# Patient Record
Sex: Male | Born: 1940 | Race: White | Hispanic: No | Marital: Married | Smoking: Never smoker
Health system: Southern US, Community
[De-identification: ages and names within clinical notes are randomized; demographics above are authoritative.]

## PROBLEM LIST (undated history)

## (undated) DIAGNOSIS — J40 Bronchitis, not specified as acute or chronic: Secondary | ICD-10-CM

## (undated) DIAGNOSIS — E119 Type 2 diabetes mellitus without complications: Secondary | ICD-10-CM

## (undated) DIAGNOSIS — J309 Allergic rhinitis, unspecified: Secondary | ICD-10-CM

## (undated) DIAGNOSIS — M1990 Unspecified osteoarthritis, unspecified site: Secondary | ICD-10-CM

## (undated) DIAGNOSIS — L570 Actinic keratosis: Secondary | ICD-10-CM

## (undated) DIAGNOSIS — N451 Epididymitis: Secondary | ICD-10-CM

## (undated) DIAGNOSIS — E785 Hyperlipidemia, unspecified: Secondary | ICD-10-CM

## (undated) DIAGNOSIS — I10 Essential (primary) hypertension: Secondary | ICD-10-CM

## (undated) HISTORY — PX: BRAIN SURGERY: SHX531

## (undated) HISTORY — PX: THROAT SURGERY: SHX803

## (undated) HISTORY — PX: HERNIA REPAIR: SHX51

---

## 1998-07-04 ENCOUNTER — Emergency Department (HOSPITAL_COMMUNITY): Admission: EM | Admit: 1998-07-04 | Discharge: 1998-07-04 | Payer: Self-pay | Admitting: Emergency Medicine

## 1998-11-12 ENCOUNTER — Encounter: Payer: Self-pay | Admitting: General Surgery

## 1998-11-13 ENCOUNTER — Inpatient Hospital Stay (HOSPITAL_COMMUNITY): Admission: EM | Admit: 1998-11-13 | Discharge: 1998-11-15 | Payer: Self-pay

## 1998-11-13 ENCOUNTER — Encounter: Payer: Self-pay | Admitting: General Surgery

## 2001-11-07 ENCOUNTER — Ambulatory Visit (HOSPITAL_BASED_OUTPATIENT_CLINIC_OR_DEPARTMENT_OTHER): Admission: RE | Admit: 2001-11-07 | Discharge: 2001-11-07 | Payer: Self-pay | Admitting: Plastic Surgery

## 2004-04-07 ENCOUNTER — Other Ambulatory Visit: Admission: RE | Admit: 2004-04-07 | Discharge: 2004-04-07 | Payer: Self-pay | Admitting: Otolaryngology

## 2005-03-03 ENCOUNTER — Ambulatory Visit: Payer: Self-pay | Admitting: Internal Medicine

## 2005-03-16 ENCOUNTER — Ambulatory Visit: Payer: Self-pay | Admitting: Internal Medicine

## 2013-02-22 ENCOUNTER — Encounter: Payer: Self-pay | Admitting: Cardiovascular Disease

## 2013-02-22 ENCOUNTER — Other Ambulatory Visit (HOSPITAL_COMMUNITY): Payer: Self-pay | Admitting: Internal Medicine

## 2013-02-22 ENCOUNTER — Ambulatory Visit (HOSPITAL_COMMUNITY): Payer: Medicare Other | Attending: Cardiovascular Disease

## 2013-02-22 DIAGNOSIS — I10 Essential (primary) hypertension: Secondary | ICD-10-CM | POA: Insufficient documentation

## 2013-02-22 DIAGNOSIS — E785 Hyperlipidemia, unspecified: Secondary | ICD-10-CM | POA: Insufficient documentation

## 2013-02-22 DIAGNOSIS — R9431 Abnormal electrocardiogram [ECG] [EKG]: Secondary | ICD-10-CM

## 2013-02-22 DIAGNOSIS — E119 Type 2 diabetes mellitus without complications: Secondary | ICD-10-CM | POA: Insufficient documentation

## 2013-02-22 DIAGNOSIS — I059 Rheumatic mitral valve disease, unspecified: Secondary | ICD-10-CM | POA: Insufficient documentation

## 2013-02-22 DIAGNOSIS — I079 Rheumatic tricuspid valve disease, unspecified: Secondary | ICD-10-CM | POA: Insufficient documentation

## 2013-02-22 NOTE — Progress Notes (Signed)
Echocardiogram performed.  

## 2014-09-18 ENCOUNTER — Other Ambulatory Visit: Payer: Self-pay | Admitting: Orthopedic Surgery

## 2014-09-18 DIAGNOSIS — M79672 Pain in left foot: Secondary | ICD-10-CM

## 2014-09-19 ENCOUNTER — Other Ambulatory Visit: Payer: Self-pay

## 2014-09-27 ENCOUNTER — Ambulatory Visit
Admission: RE | Admit: 2014-09-27 | Discharge: 2014-09-27 | Disposition: A | Discharge status: Home / Self Care | Payer: Medicare Other | Source: Ambulatory Visit | Attending: Orthopedic Surgery | Admitting: Orthopedic Surgery

## 2014-09-27 DIAGNOSIS — M79672 Pain in left foot: Secondary | ICD-10-CM

## 2014-10-01 ENCOUNTER — Emergency Department (HOSPITAL_COMMUNITY)
Admission: EM | Admit: 2014-10-01 | Discharge: 2014-10-01 | Disposition: A | Discharge status: Home / Self Care | Payer: Medicare Other | Attending: Emergency Medicine | Admitting: Emergency Medicine

## 2014-10-01 ENCOUNTER — Encounter (HOSPITAL_COMMUNITY): Payer: Self-pay | Admitting: Nurse Practitioner

## 2014-10-01 ENCOUNTER — Emergency Department (HOSPITAL_COMMUNITY): Payer: Medicare Other

## 2014-10-01 DIAGNOSIS — Z87448 Personal history of other diseases of urinary system: Secondary | ICD-10-CM | POA: Insufficient documentation

## 2014-10-01 DIAGNOSIS — R5383 Other fatigue: Secondary | ICD-10-CM | POA: Insufficient documentation

## 2014-10-01 DIAGNOSIS — Z8739 Personal history of other diseases of the musculoskeletal system and connective tissue: Secondary | ICD-10-CM | POA: Diagnosis not present

## 2014-10-01 DIAGNOSIS — R05 Cough: Secondary | ICD-10-CM | POA: Diagnosis present

## 2014-10-01 DIAGNOSIS — I10 Essential (primary) hypertension: Secondary | ICD-10-CM | POA: Insufficient documentation

## 2014-10-01 DIAGNOSIS — J159 Unspecified bacterial pneumonia: Secondary | ICD-10-CM | POA: Diagnosis not present

## 2014-10-01 DIAGNOSIS — J189 Pneumonia, unspecified organism: Secondary | ICD-10-CM

## 2014-10-01 DIAGNOSIS — Z872 Personal history of diseases of the skin and subcutaneous tissue: Secondary | ICD-10-CM | POA: Diagnosis not present

## 2014-10-01 DIAGNOSIS — E119 Type 2 diabetes mellitus without complications: Secondary | ICD-10-CM | POA: Insufficient documentation

## 2014-10-01 DIAGNOSIS — R059 Cough, unspecified: Secondary | ICD-10-CM

## 2014-10-01 HISTORY — DX: Type 2 diabetes mellitus without complications: E11.9

## 2014-10-01 HISTORY — DX: Unspecified osteoarthritis, unspecified site: M19.90

## 2014-10-01 HISTORY — DX: Essential (primary) hypertension: I10

## 2014-10-01 HISTORY — DX: Allergic rhinitis, unspecified: J30.9

## 2014-10-01 HISTORY — DX: Actinic keratosis: L57.0

## 2014-10-01 HISTORY — DX: Epididymitis: N45.1

## 2014-10-01 HISTORY — DX: Bronchitis, not specified as acute or chronic: J40

## 2014-10-01 HISTORY — DX: Hyperlipidemia, unspecified: E78.5

## 2014-10-01 LAB — I-STAT CHEM 8, ED
BUN: 28 mg/dL — ABNORMAL HIGH (ref 6–20)
CHLORIDE: 99 mmol/L — AB (ref 101–111)
Calcium, Ion: 1.07 mmol/L — ABNORMAL LOW (ref 1.13–1.30)
Creatinine, Ser: 1.1 mg/dL (ref 0.61–1.24)
Glucose, Bld: 95 mg/dL (ref 65–99)
HCT: 47 % (ref 39.0–52.0)
Hemoglobin: 16 g/dL (ref 13.0–17.0)
POTASSIUM: 6.4 mmol/L — AB (ref 3.5–5.1)
Sodium: 132 mmol/L — ABNORMAL LOW (ref 135–145)
TCO2: 25 mmol/L (ref 0–100)

## 2014-10-01 LAB — BASIC METABOLIC PANEL
ANION GAP: 10 (ref 5–15)
BUN: 17 mg/dL (ref 6–20)
CHLORIDE: 100 mmol/L — AB (ref 101–111)
CO2: 24 mmol/L (ref 22–32)
Calcium: 9.1 mg/dL (ref 8.9–10.3)
Creatinine, Ser: 1.03 mg/dL (ref 0.61–1.24)
GFR calc non Af Amer: >60 mL/min (ref >60)
Glucose, Bld: 93 mg/dL (ref 65–99)
POTASSIUM: 3.9 mmol/L (ref 3.5–5.1)
SODIUM: 134 mmol/L — AB (ref 135–145)

## 2014-10-01 LAB — CBC WITH DIFFERENTIAL/PLATELET
Basophils Absolute: 0 10*3/uL (ref 0.0–0.1)
Basophils Relative: 0 % (ref 0–1)
EOS ABS: 0 10*3/uL (ref 0.0–0.7)
Eosinophils Relative: 0 % (ref 0–5)
HCT: 42.1 % (ref 39.0–52.0)
Hemoglobin: 14.7 g/dL (ref 13.0–17.0)
LYMPHS ABS: 1.1 10*3/uL (ref 0.7–4.0)
Lymphocytes Relative: 13 % (ref 12–46)
MCH: 30.8 pg (ref 26.0–34.0)
MCHC: 34.9 g/dL (ref 30.0–36.0)
MCV: 88.1 fL (ref 78.0–100.0)
Monocytes Absolute: 1 10*3/uL (ref 0.1–1.0)
Monocytes Relative: 12 % (ref 3–12)
Neutro Abs: 6.5 10*3/uL (ref 1.7–7.7)
Neutrophils Relative %: 75 % (ref 43–77)
Platelets: 134 10*3/uL — ABNORMAL LOW (ref 150–400)
RBC: 4.78 MIL/uL (ref 4.22–5.81)
RDW: 13.9 % (ref 11.5–15.5)
WBC: 8.7 10*3/uL (ref 4.0–10.5)

## 2014-10-01 LAB — I-STAT CG4 LACTIC ACID, ED: Lactic Acid, Venous: 2.06 mmol/L (ref 0.5–2.0)

## 2014-10-01 MED ORDER — SODIUM CHLORIDE 0.9 % IV BOLUS (SEPSIS)
1000.0000 mL | Freq: Once | INTRAVENOUS | Status: AC
  Administered 2014-10-01: 1000 mL via INTRAVENOUS

## 2014-10-01 MED ORDER — DEXTROSE 5 % IV SOLN
500.0000 mg | Freq: Once | INTRAVENOUS | Status: AC
  Administered 2014-10-01: 500 mg via INTRAVENOUS
  Filled 2014-10-01: qty 500

## 2014-10-01 MED ORDER — AZITHROMYCIN 250 MG PO TABS: 250.0000 mg | ORAL_TABLET | Freq: Every day | ORAL | Status: DC

## 2014-10-01 MED ORDER — ACETAMINOPHEN 325 MG PO TABS
650.0000 mg | ORAL_TABLET | Freq: Once | ORAL | Status: AC
  Administered 2014-10-01: 650 mg via ORAL
  Filled 2014-10-01: qty 2

## 2014-10-01 MED ORDER — DEXTROSE 5 % IV SOLN
1.0000 g | Freq: Once | INTRAVENOUS | Status: AC
  Administered 2014-10-01: 1 g via INTRAVENOUS
  Filled 2014-10-01: qty 10

## 2014-10-01 NOTE — ED Notes (Signed)
Pt was sent from [facility] for treatment of RML pneumonia found on CXR in the office this am. PT c/o week long history of fatigue, not feeling well, lower back pain, dry cough that became productive this am. He is A&Ox4, resp e/u

## 2014-10-01 NOTE — ED Provider Notes (Signed)
CSN: [id number]     Arrival date & time 10/01/14  1129 History   First MD Initiated Contact with Patient 10/01/14 1150     Chief Complaint  Patient presents with  . Pneumonia     (Consider location/radiation/quality/duration/timing/severity/associated sxs/prior Treatment) HPI Comments: Patient with past medical history of hypertension, diabetes, and hyperlipidemia presents to the emergency department with chief complaint of generalized fatigue, chills, and nonproductive cough. Patient was seen by his primary care provider today, Dr. Dagmar Hait, who sent the patient to the emergency department for treatment and evaluation of pneumonia. Patient states that he has been sleeping approximately 20 hours per day for the past 3 days. He also reports feeling very lightheaded. He denies any chest pain, shortness of breath, or abdominal pain. There are no aggravating or alleviating factors. Of note, the patient recently returned from a trip to [place] approximately 2 weeks ago. He denies any history of PE or DVT.  The history is provided by the patient. No language interpreter was used.    Past Medical History  Diagnosis Date  . Bronchitis   . Epididymitis   . Degenerative joint disease   . Allergic rhinitis   . Actinic keratosis   . Hypertension   . Diabetes mellitus without complication   . Hyperlipemia    History reviewed. No pertinent past surgical history. History reviewed. No pertinent family history. History  Substance Use Topics  . Smoking status: Never Smoker   . Smokeless tobacco: Not on file  . Alcohol Use: Yes    Review of Systems  Constitutional: Positive for fever, chills and fatigue.  Respiratory: Positive for cough. Negative for shortness of breath.   Cardiovascular: Negative for chest pain.  Gastrointestinal: Negative for nausea, vomiting, diarrhea and constipation.  Genitourinary: Negative for dysuria.  All other systems reviewed and are negative.     Allergies  Review  of patient's allergies indicates no known allergies.  Home Medications   Prior to Admission medications   Not on File   BP 132/71 mmHg  Pulse 70  Temp(Src) 98.2 F (36.8 C) (Oral)  Resp 17  Ht 5\' 11"  (1.803 m)  Wt 187 lb 12.8 oz (85.186 kg)  BMI 26.20 kg/m2  SpO2 100% Physical Exam  Constitutional: He is oriented to person, place, and time. He appears well-developed and well-nourished.  HENT:  Head: Normocephalic and atraumatic.  Eyes: Conjunctivae and EOM are normal. Pupils are equal, round, and reactive to light. Right eye exhibits no discharge. Left eye exhibits no discharge. No scleral icterus.  Neck: Normal range of motion. Neck supple. No JVD present.  Cardiovascular: Normal rate, regular rhythm and normal heart sounds.  Exam reveals no gallop and no friction rub.   No murmur heard. Pulmonary/Chest: Effort normal and breath sounds normal. No respiratory distress. He has no wheezes. He has no rales. He exhibits no tenderness.  Abdominal: Soft. He exhibits no distension and no mass. There is no tenderness. There is no rebound and no guarding.  Musculoskeletal: Normal range of motion. He exhibits no edema or tenderness.  Neurological: He is alert and oriented to person, place, and time.  Skin: Skin is warm and dry.  Psychiatric: He has a normal mood and affect. His behavior is normal. Judgment and thought content normal.  Nursing note and vitals reviewed.   ED Course  Procedures (including critical care time) Results for orders placed or performed during the hospital encounter of 10/01/14  CBC with Differential/Platelet  Result Value Ref Range  WBC 8.7 4.0 - 10.5 K/uL   RBC 4.78 4.22 - 5.81 MIL/uL   Hemoglobin 14.7 13.0 - 17.0 g/dL   HCT 42.1 39.0 - 52.0 %   MCV 88.1 78.0 - 100.0 fL   MCH 30.8 26.0 - 34.0 pg   MCHC 34.9 30.0 - 36.0 g/dL   RDW 13.9 11.5 - 15.5 %   Platelets 134 (L) 150 - 400 K/uL   Neutrophils Relative % 75 43 - 77 %   Neutro Abs 6.5 1.7 - 7.7 K/uL    Lymphocytes Relative 13 12 - 46 %   Lymphs Abs 1.1 0.7 - 4.0 K/uL   Monocytes Relative 12 3 - 12 %   Monocytes Absolute 1.0 0.1 - 1.0 K/uL   Eosinophils Relative 0 0 - 5 %   Eosinophils Absolute 0.0 0.0 - 0.7 K/uL   Basophils Relative 0 0 - 1 %   Basophils Absolute 0.0 0.0 - 0.1 K/uL  Basic metabolic panel  Result Value Ref Range   Sodium 134 (L) 135 - 145 mmol/L   Potassium 3.9 3.5 - 5.1 mmol/L   Chloride 100 (L) 101 - 111 mmol/L   CO2 24 22 - 32 mmol/L   Glucose, Bld 93 65 - 99 mg/dL   BUN 17 6 - 20 mg/dL   Creatinine, Ser 1.03 0.61 - 1.24 mg/dL   Calcium 9.1 8.9 - 10.3 mg/dL   GFR calc non Af Amer >60 >60 mL/min   GFR calc Af Amer >60 >60 mL/min   Anion gap 10 5 - 15  I-stat chem 8, ed  Result Value Ref Range   Sodium 132 (L) 135 - 145 mmol/L   Potassium 6.4 (HH) 3.5 - 5.1 mmol/L   Chloride 99 (L) 101 - 111 mmol/L   BUN 28 (H) 6 - 20 mg/dL   Creatinine, Ser 1.10 0.61 - 1.24 mg/dL   Glucose, Bld 95 65 - 99 mg/dL   Calcium, Ion 1.07 (L) 1.13 - 1.30 mmol/L   TCO2 25 0 - 100 mmol/L   Hemoglobin 16.0 13.0 - 17.0 g/dL   HCT 47.0 39.0 - 52.0 %   Comment NOTIFIED PHYSICIAN   I-Stat CG4 Lactic Acid, ED  Result Value Ref Range   Lactic Acid, Venous 2.06 (HH) 0.5 - 2.0 mmol/L   Comment NOTIFIED PHYSICIAN    Dg Chest 2 View  10/01/2014   CLINICAL DATA:  Chills and dizziness  EXAM: CHEST - 2 VIEW  COMPARISON:  None.  FINDINGS: Cardiac shadow is within normal limits. The lungs are well aerated bilaterally. Increased density is noted in the inferior aspect of the right upper lobe along the minor fissure consistent with acute pneumonia. No acute bony abnormality is seen.  IMPRESSION: Right upper lobe infiltrate.  Followup PA and lateral chest X-ray is recommended in 3-4 weeks following trial of antibiotic therapy to ensure resolution and exclude underlying malignancy.   Electronically Signed   By: Inez Catalina M.D.   On: 10/01/2014 13:01   Mr Foot Left Wo Contrast  09/27/2014    CLINICAL DATA:  Left foot pain most notable about the great toe and base of the first metatarsal. Initial encounter.  EXAM: MRI OF THE LEFT FOREFOOT WITHOUT CONTRAST  TECHNIQUE: Multiplanar, multisequence MR imaging was performed. No intravenous contrast was administered.  COMPARISON:  None.  FINDINGS: The patient has advanced appearing degenerative disease about the midfoot appearing worst at the articulation of the navicular and medial cuneiform and the second and third tarsometatarsal joints.  There is subchondral edema about all these joints with joint space narrowing and osteophytosis. The first MTP joint is unremarkable. No fracture, stress change or worrisome marrow lesion is identified. A small intraosseous geode at the base of the fifth metatarsal is incidentally noted. All visualized tendons and intrinsic musculature unremarkable. No mass or fluid collection is identified.  IMPRESSION: Advanced degenerative disease about the articulation of the navicular and medial cuneiform and second and third tarsometatarsal joints.  No finding to explain first MTP pain.  The joint appears normal.   Electronically Signed   By: Inge Rise M.D.   On: 09/27/2014 09:11      EKG Interpretation   Date/Time:  Tuesday October 01 2014 13:46:29 EDT Ventricular Rate:  69 PR Interval:  210 QRS Duration: 157 QT Interval:  442 QTC Calculation: 473 R Axis:   -71 Text Interpretation:  Sinus rhythm RBBB and LAFB Abnormal lateral Q waves  No prior for comparison Confirmed by Mingo Amber  MD, [place] ([id number]) on 10/01/2014  1:54:44 PM      MDM   Final diagnoses:  Cough  CAP (community acquired pneumonia)    Patient sent from primary care office to be evaluated for pneumonia/dehydration. Will check labs, chest x-ray, and will reassess.  CXR consistent with pneumonia. Will treat with rocephin and azithro.  Patient seen by and discussed with Dr. Mingo Amber, who recommends recheck of K (which was 6.4, but i-stat unable to  comment on hemolysis), if normal labs then DC to home.  Otherwise, admit.  Patient wishes to go home.  Labs are reassuring. Patient ambulates maintaining pulse oxygenation of 95%, heart rate stayed in the 70s. Will discharge to home. Return precautions given.   Montine Circle, PA-C 10/01/14 1546  Evelina Bucy, MD 10/01/14 [phone]

## 2014-10-01 NOTE — Discharge Instructions (Signed)

## 2014-10-01 NOTE — ED Notes (Signed)
Ambulated with pt. O2sats stayed above 95%ra while ambulating. HR stayed in the 70s.

## 2014-10-01 NOTE — ED Notes (Signed)
Per Dr. Dagmar Hait office, patient sent for chills, fatigue, non productive cough, lower lobe PNA and needs an evaluation for admission.

## 2014-10-06 LAB — CULTURE, BLOOD (ROUTINE X 2)
CULTURE: NO GROWTH
Culture: NO GROWTH

## 2015-01-09 ENCOUNTER — Ambulatory Visit (INDEPENDENT_AMBULATORY_CARE_PROVIDER_SITE_OTHER): Payer: Medicare Other | Admitting: Sports Medicine

## 2015-01-09 ENCOUNTER — Encounter: Payer: Self-pay | Admitting: Sports Medicine

## 2015-01-09 VITALS — BP 123/73 | HR 52 | Ht 72.0 in | Wt 188.0 lb

## 2015-01-09 DIAGNOSIS — M19079 Primary osteoarthritis, unspecified ankle and foot: Secondary | ICD-10-CM | POA: Insufficient documentation

## 2015-01-09 DIAGNOSIS — M129 Arthropathy, unspecified: Secondary | ICD-10-CM | POA: Diagnosis not present

## 2015-01-09 DIAGNOSIS — M79675 Pain in left toe(s): Secondary | ICD-10-CM | POA: Diagnosis not present

## 2015-01-09 DIAGNOSIS — R269 Unspecified abnormalities of gait and mobility: Secondary | ICD-10-CM | POA: Insufficient documentation

## 2015-01-09 DIAGNOSIS — M2170 Unequal limb length (acquired), unspecified site: Secondary | ICD-10-CM | POA: Insufficient documentation

## 2015-01-09 NOTE — Assessment & Plan Note (Signed)
Wedged lift on RT to help correct pronation and the LLI

## 2015-01-09 NOTE — Assessment & Plan Note (Signed)
Sports insoles Lift on RT Heel wedge LT Scaphoid support bilaterally First ray post LT  This really decreased his pronatino and toe out on post correction gait  Reck 1 mo and custom orthotics ?

## 2015-01-09 NOTE — Assessment & Plan Note (Signed)
This seems arise from biomechanical stress Less pain with temp correction We will evaluate p 1 mo trial

## 2015-01-09 NOTE — Assessment & Plan Note (Signed)
additioanl arch support to try to slow progress  Aleve is OK

## 2015-01-09 NOTE — Progress Notes (Signed)
  Randall Hudson - 74 y.o. male MRN [id number]  Date of birth: 1940-04-25 Randall Hudson is a 74 y.o. male who presents today for left great toe pain  Toe pain - Located on medial aspect of MTP 1. . It hurt a lot getting up after sitting on a plane. It hurts when use . It hurts during the night. Walking helps walk out the pain. Special shoes help, dress shoes and loafers hurt. Has been wearing OTC orthotics in June which had helped initially, but benefit has weaned. Has no limitations to strength, function.  It hurts when walking up hill. Randall Hudson very frequently which helps the pain a lot.  Tried to epson salts in warm water which have not helped.  His orthopedist attempted a corticosteriod injection the first PIP joint of left big toe which was not effective in helping his pain.  Soc Hx:  Still working in Estate agent  Past work as Pharmacist, community retirement/ married  ROS  No redness. Swelling or rash No swelling of other joints Rt great toe is not painful Denies serios illnesses Denies constitutional sxs  Exam:   Gen: NAD BP 123/73 mmHg  Pulse 52  Ht 6' (1.829 m)  Wt 188 lb (85.276 kg)  BMI 25.49 kg/m2  Cardiorespiratory - Normal respiratory effort/rate.  RRR MSK:  Inspection: marked bossing of the tarso-metatarsal joints bilaterally,  Ankle positions are slightly subluxed on foot bilat. Leg length inequality with distance from ASIS to medial epicondyle is 2-2.5 cm longer on left than on right  ROM: normal ankle, knee, and toe flexion/extension bilaterally  Strength: 5/5 strength knee, ankle, toe flexion/extension bilaterally, 5/5 strength on foot inversion/eversion bilaterally  Stance: left iliac crest lies noticeably higher than right in normal stance, left shoulder is mildly elevated compared to right  Gait: notable out-toeing gait with pronation of feet bilaterally   Imaging: MRI (left foot) 09/26/24 - advanced degenerative disease about the articulation of the  navicular and medial cuneiform and second and third tarsometatarsal joints/ This affeccts bilateral feet/ MTP joint and PIP joint of let great toe was unremarkable  Assessment: Randall Hudson is a 74 yo male with 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 While these anatomical changes are likely not directly the source of his pain, they appear to have altered the biomechanical functioning of his feet leading to collapse of the medial longitudinal arch during gait, associated pronation of the feet, and excessive stress being placed on the longitudinal arch. 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  Plan: 1. Prepared trial inserts for both feet to correct the leg length discrepancy and support medial arches bilaterally 2. Will trial shoe inserts for one month with plans to make customized orthotics at that time if the inserts are successful

## 2015-02-11 ENCOUNTER — Ambulatory Visit (INDEPENDENT_AMBULATORY_CARE_PROVIDER_SITE_OTHER): Payer: Medicare Other | Admitting: Sports Medicine

## 2015-02-11 ENCOUNTER — Encounter: Payer: Self-pay | Admitting: Sports Medicine

## 2015-02-11 VITALS — BP 129/71 | HR 54 | Ht 72.0 in | Wt 188.0 lb

## 2015-02-11 DIAGNOSIS — M129 Arthropathy, unspecified: Secondary | ICD-10-CM

## 2015-02-11 DIAGNOSIS — M79675 Pain in left toe(s): Secondary | ICD-10-CM | POA: Diagnosis not present

## 2015-02-11 DIAGNOSIS — M2170 Unequal limb length (acquired), unspecified site: Secondary | ICD-10-CM | POA: Diagnosis not present

## 2015-02-11 DIAGNOSIS — R269 Unspecified abnormalities of gait and mobility: Secondary | ICD-10-CM

## 2015-02-11 DIAGNOSIS — M19079 Primary osteoarthritis, unspecified ankle and foot: Secondary | ICD-10-CM

## 2015-02-11 NOTE — Progress Notes (Signed)
Patient ID: GLENNIS MONTENEGRO, male   DOB: Mar 04, 1941, 74 y.o.   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 midffot/ slt shift of MTP 1 We found some hallux rigidus as well as early bunion change  We provided him with a test of temp orthotics with first ray protection and med heel wege on left  RT leg was shorter and lift was added  75% reduction in pain in 1 month Now starting to walk some for exercise  ROS No swelling MTP 1 No redness of either foot Bump over dorsum of left foot is TT touch Denies swelling of other joints No fever or chills  Pexam NAD BP 129/71 mmHg  Pulse 54  Ht 6' (1.829 m)  Wt 188 lb (85.276 kg)  BMI 25.49 kg/m2  Left foot with obvious MTT bossing RT foot with mild midfoot change  There is some medial rotation and mild hallux shift of MTP Lt > RT  Motion of MTP 1 is limited RT andLLt but mostly left  RT leg is 2 cms shorter  Gait in temp orthotic shows correction of pronation by about 60% Leg length difference is corrected enough to block trendelenburg shift  Barefoot he has pain w walking  Gets trendelenburg shift and He gets pronation

## 2015-02-11 NOTE — Assessment & Plan Note (Signed)
Pronation/ trendelenburg and out toeing  Patient was fitted for a : standard, cushioned, semi-rigid orthotic. The orthotic was heated and afterward the patient stood on the orthotic blank positioned on the orthotic stand. The patient was positioned in subtalar neutral position and 10 degrees of ankle dorsiflexion in a weight bearing stance. After completion of molding, a stable base was applied to the orthotic blank. The blank was ground to a stable position for weight bearing. Size:11 black suede EVA Base: blue med dens EVA Posting:lift on RT with red brick EVA Additional orthotic padding: - first ray post  I spent 45 minutes face to face for evaluation and preparation of these to help correct his gait and foot pain/ greater 50% in counseling and dealing with issues  Gait post orthotics shoed 90% correction of pronation Control of trendelenburg shift Out toeing is 80% less on rt  And LT

## 2015-02-11 NOTE — Assessment & Plan Note (Signed)
This is severe enough that he needs to use orthotic support for pain control  With temp orthotics got relief of 85%

## 2015-02-11 NOTE — Assessment & Plan Note (Signed)
Correction for RT i all shoes

## 2015-02-11 NOTE — Assessment & Plan Note (Signed)
Continues to improve with biomechanical change   Able to reduce NSAID use

## 2015-02-12 ENCOUNTER — Ambulatory Visit: Payer: Medicare Other | Admitting: Sports Medicine

## 2015-02-26 ENCOUNTER — Encounter: Payer: Self-pay | Admitting: Internal Medicine

## 2015-09-23 ENCOUNTER — Ambulatory Visit (INDEPENDENT_AMBULATORY_CARE_PROVIDER_SITE_OTHER): Payer: Medicare Other | Admitting: Sports Medicine

## 2015-09-23 ENCOUNTER — Encounter: Payer: Self-pay | Admitting: Sports Medicine

## 2015-09-23 VITALS — BP 131/72 | HR 54 | Ht 71.0 in | Wt 189.0 lb

## 2015-09-23 DIAGNOSIS — M129 Arthropathy, unspecified: Secondary | ICD-10-CM | POA: Diagnosis not present

## 2015-09-23 DIAGNOSIS — M79675 Pain in left toe(s): Secondary | ICD-10-CM | POA: Diagnosis not present

## 2015-09-23 DIAGNOSIS — R269 Unspecified abnormalities of gait and mobility: Secondary | ICD-10-CM | POA: Diagnosis not present

## 2015-09-23 DIAGNOSIS — M19079 Primary osteoarthritis, unspecified ankle and foot: Secondary | ICD-10-CM

## 2015-09-23 NOTE — Assessment & Plan Note (Signed)
Trial w first ray posting on custom orthotics

## 2015-09-23 NOTE — Progress Notes (Signed)
Patient ID: Randall Hudson, male   DOB: 1940-05-04, 75 y.o.   MRN: [id number]  CC: Chronic left foot pain  Patient seen 11/8 for Gait abnormality Also foot pain with documented DJD of midfoot We made a temporary orthotic with more correction to control his trendelenburg gait This got rid of 85% of pain  Still some sharp pain over left mid foot Sometimes feels sharp pain in left great toe After sitting for too long foot and left great toe will swell  Past HX Leg leng ineq HTN with good control  Soc Hx  Non smoker Retired from management  ROS Left shoulder stiffness and pain Denies hip pain No knee pain at present No joint swelling x lt great toe  PEXAM NAD alert w M BP 131/72 mmHg  Pulse 54  Ht 5\' 11"  (1.803 m)  Wt 189 lb (85.73 kg)  BMI 26.37 kg/m2   TMT bossing bilat No swelling but some limited motion of left gt toe Left leg is 2 cm longer than RT  Gait shows  Trendelenburg to RT that is significant Resting pronation bilat Increase in pronation and foot eversion on walking Mils subluxation both ankles   SEE PROB List  Note also has some limitation of motion and pain in left shoulder I have asked him to return for evaluation and [place]

## 2015-09-23 NOTE — Assessment & Plan Note (Signed)
Use topical meds and use good support in all shoes

## 2015-09-23 NOTE — Assessment & Plan Note (Signed)
He needs permanent orthotics in all shoes to correct for gait issues  New pair made today with even more correction for leg length to try to control trendelenburg  Patient was fitted for a : standard, cushioned, semi-rigid orthotic. The orthotic was heated and afterward the patient stood on the orthotic blank positioned on the orthotic stand. The patient was positioned in subtalar neutral position and 10 degrees of ankle dorsiflexion in a weight bearing stance. After completion of molding, a stable base was applied to the orthotic blank. The blank was ground to a stable position for weight bearing. Size: 11 Red EVA Base: blue EVA Posting: first Ray on RT and LT Additional orthotic padding: lift on RT with blue EVA/ heel wedge on left  Prep time 40 mins  Note his gait is much improved post orthotic prep

## 2015-11-05 ENCOUNTER — Ambulatory Visit (INDEPENDENT_AMBULATORY_CARE_PROVIDER_SITE_OTHER): Payer: Medicare Other | Admitting: Sports Medicine

## 2015-11-05 VITALS — BP 120/63 | HR 57 | Ht 72.0 in | Wt 185.0 lb

## 2015-11-05 DIAGNOSIS — M75122 Complete rotator cuff tear or rupture of left shoulder, not specified as traumatic: Secondary | ICD-10-CM

## 2015-11-05 DIAGNOSIS — M25512 Pain in left shoulder: Secondary | ICD-10-CM

## 2015-11-05 DIAGNOSIS — M79675 Pain in left toe(s): Secondary | ICD-10-CM | POA: Diagnosis not present

## 2015-11-05 MED ORDER — DICLOFENAC SODIUM 1 % TD GEL: 2.0000 g | Freq: Four times a day (QID) | TRANSDERMAL | 2 refills | Status: DC

## 2015-11-05 NOTE — Assessment & Plan Note (Signed)
I think he has Hallux rigidus Possible entrapment of ext tendon of great toe at midfoot Top voltaren

## 2015-11-05 NOTE — Assessment & Plan Note (Signed)
We will check XR and MRI of left shoulder  Pending the findings will consider CSI and NTG along with Codman exercises  He is not that interested in surgery at age 75 but we need to see if surgical option looks advisable

## 2015-11-05 NOTE — Progress Notes (Signed)
CC; Left shoulder weakness  2. Left great toe pain  Left shoulder is difficult to move normally Hard to put on shirt Very weak when he tried to shoot baskets with grandson Gets night time pain Aches with any activity  Left Great toe Pain down the dorsal surface after sitting No swelling or injury orthtoics helped other foot issues but not this  ROS Denies radicular sxs from neck RT shoulder not painful Sometimes feels weak  PEXAM  Pleasant man NAD BP 120/63   Pulse (!) 57   Ht 6' (1.829 m)   Wt 185 lb (83.9 kg)   BMI 25.09 kg/m   Left great toe Extends to 20 deg Only 5 deg of flexion Pain w foreced flexion Midfoot arthritis  Left shoulder BT absent on palpation ROM is limited in abduction/ elevation Limited in forward flexion ER is locked in Froz shoulder test  + drop arm on left Weak and painful on speeds and yergasons Weak on SST  [place] of left shoulder Absence of BT in groove Seroma at level of pec tendon to humerus Absent and retracted supraspin tendon with hypoechoic change Subcap intact but atrophy and irregularity of ant humeral head Infraspinatus and TM are normal

## 2015-11-06 ENCOUNTER — Telehealth: Payer: Self-pay | Admitting: *Deleted

## 2015-11-06 ENCOUNTER — Ambulatory Visit
Admission: RE | Admit: 2015-11-06 | Discharge: 2015-11-06 | Disposition: A | Discharge status: Home / Self Care | Payer: Medicare Other | Source: Ambulatory Visit | Attending: Sports Medicine | Admitting: Sports Medicine

## 2015-11-06 ENCOUNTER — Ambulatory Visit: Payer: Medicare Other | Admitting: Sports Medicine

## 2015-11-06 DIAGNOSIS — M25512 Pain in left shoulder: Secondary | ICD-10-CM

## 2015-11-06 NOTE — Telephone Encounter (Signed)
Spoke with insurance to receive Prior Auth for diclofenac gel.  Approved from 11/06/15 to 04/04/16 [id number]  Patient and pharmacy notified

## 2015-11-13 ENCOUNTER — Other Ambulatory Visit: Payer: Medicare Other

## 2015-11-14 ENCOUNTER — Ambulatory Visit
Admission: RE | Admit: 2015-11-14 | Discharge: 2015-11-14 | Disposition: A | Discharge status: Home / Self Care | Payer: Medicare Other | Source: Ambulatory Visit | Attending: Sports Medicine | Admitting: Sports Medicine

## 2015-11-14 DIAGNOSIS — M25512 Pain in left shoulder: Secondary | ICD-10-CM

## 2016-01-28 ENCOUNTER — Ambulatory Visit (INDEPENDENT_AMBULATORY_CARE_PROVIDER_SITE_OTHER): Payer: Medicare Other | Admitting: Sports Medicine

## 2016-01-28 ENCOUNTER — Other Ambulatory Visit: Payer: Self-pay | Admitting: *Deleted

## 2016-01-28 ENCOUNTER — Encounter: Payer: Self-pay | Admitting: Sports Medicine

## 2016-01-28 DIAGNOSIS — M75122 Complete rotator cuff tear or rupture of left shoulder, not specified as traumatic: Secondary | ICD-10-CM | POA: Diagnosis not present

## 2016-01-28 DIAGNOSIS — M79675 Pain in left toe(s): Secondary | ICD-10-CM | POA: Diagnosis not present

## 2016-01-28 NOTE — Progress Notes (Signed)
  Randall Hudson - 75 y.o. male MRN [id number]  Date of birth: 1940/10/24  SUBJECTIVE:  Including CC & ROS.  Chief Complaint  Patient presents with  . Shoulder Pain  . Toe Pain   Mr. Randall Hudson is a 75 yo M that is following up for shoulder pain and left great toe pain. His reports to doing well with the exercises for his shoulder. He has been able to increase his function. His pain is occasional at night. His MRI was performed showing significant arthritis of the joint.   He is also reporting left great toe pain.  The pain has gotten worse over the last three months. He has pain on the dorsal aspect of the left 1st MTP. He denies any trauma or injury. He also has some pain in the left digit. He has tried orthotics which have helped. He denies any history of gout.   ROS: No unexpected weight loss, fever, chills, swelling, instability,  numbness/tingling, redness, otherwise see HPI   HISTORY: Past Medical, Surgical, Social, and Family History Reviewed & Updated per EMR.   Pertinent Historical Findings include: PMSHx -  HTN  DATA REVIEWED: MRI: left shoulder 11/14/15: 1. Severe osteoarthritis of the glenohumeral joint with a prominent glenohumeral joint effusion. 2. Focal degeneration of the supraspinous tendon without a discrete tear. 3. Slight subacromial/subdeltoid bursitis  PHYSICAL EXAM:  VS: BP:(!) 142/88  HR:(!) 58bpm  TEMP: ( )  RESP:   HT:5\' 11"  (180.3 cm)   WT:191 lb (86.6 kg)  BMI:26.7 PHYSICAL EXAM: Gen: NAD, alert, cooperative with exam,  HEENT: clear conjunctiva, EOMI CV:  no edema, capillary refill brisk,  Resp: non-labored, normal speech Skin: no rashes, normal turgor  Neuro: no gross deficits.  Psych:  alert and oriented Left shoulder:  Mild TTP of the AC joint  TTP over the proximal BT  Normal elbow flexion and extension  Active flexion and abduction to 70 degrees  Passive abduction and flexion to 80-90 degrees Some weakness to resistance to IR and ER  Pain with  empty can test  Positive Hawkin's test  Foot:  No overlying redness, warmth or swelling of the left great toe  TMT bossing of the midfoot on the left foot.  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  - advised to continue exercises  - he could consider intermittent CSI to help with pain.    Pain of left great toe Possible that he may have a component of sesamoiditis. His pain returned after three weeks of using the topical medication - L post was placed under the left great toe and had significant improvement. Advised to bring in other orthotics to his the same area posted with an L cushion.

## 2016-02-01 NOTE — Assessment & Plan Note (Signed)
MRI is showing significant pathology within his shoulder joint and within the Rotator cuff.  - advised to continue exercises  - he could consider intermittent CSI to help with pain.

## 2016-02-01 NOTE — Assessment & Plan Note (Signed)
Possible that he may have a component of sesamoiditis. His pain returned after three weeks of using the topical medication - L post was placed under the left great toe and had significant improvement. Advised to bring in other orthotics to his the same area posted with an L cushion.

## 2016-12-08 ENCOUNTER — Other Ambulatory Visit (HOSPITAL_COMMUNITY): Payer: Self-pay | Admitting: Internal Medicine

## 2016-12-08 ENCOUNTER — Ambulatory Visit (HOSPITAL_COMMUNITY)
Admission: RE | Admit: 2016-12-08 | Discharge: 2016-12-08 | Disposition: A | Discharge status: Home / Self Care | Payer: Medicare Other | Source: Ambulatory Visit | Attending: Vascular Surgery | Admitting: Vascular Surgery

## 2016-12-08 DIAGNOSIS — M79604 Pain in right leg: Secondary | ICD-10-CM | POA: Diagnosis not present

## 2016-12-08 DIAGNOSIS — M7989 Other specified soft tissue disorders: Secondary | ICD-10-CM | POA: Diagnosis not present

## 2016-12-08 DIAGNOSIS — R590 Localized enlarged lymph nodes: Secondary | ICD-10-CM | POA: Insufficient documentation

## 2016-12-09 ENCOUNTER — Other Ambulatory Visit: Payer: Self-pay | Admitting: Internal Medicine

## 2016-12-09 ENCOUNTER — Ambulatory Visit
Admission: RE | Admit: 2016-12-09 | Discharge: 2016-12-09 | Disposition: A | Discharge status: Home / Self Care | Payer: Medicare Other | Source: Ambulatory Visit | Attending: Internal Medicine | Admitting: Internal Medicine

## 2016-12-09 DIAGNOSIS — R599 Enlarged lymph nodes, unspecified: Secondary | ICD-10-CM

## 2016-12-09 MED ORDER — IOPAMIDOL (ISOVUE-300) INJECTION 61%
100.0000 mL | Freq: Once | INTRAVENOUS | Status: AC | PRN
  Administered 2016-12-09: 100 mL via INTRAVENOUS

## 2016-12-10 ENCOUNTER — Other Ambulatory Visit: Payer: Medicare Other

## 2017-03-10 ENCOUNTER — Ambulatory Visit: Payer: Medicare Other | Admitting: Sports Medicine

## 2017-03-10 ENCOUNTER — Encounter: Payer: Self-pay | Admitting: Sports Medicine

## 2017-03-10 DIAGNOSIS — M19079 Primary osteoarthritis, unspecified ankle and foot: Secondary | ICD-10-CM

## 2017-03-10 DIAGNOSIS — M19012 Primary osteoarthritis, left shoulder: Secondary | ICD-10-CM | POA: Diagnosis not present

## 2017-03-10 MED ORDER — METHYLPREDNISOLONE ACETATE 40 MG/ML IJ SUSP
40.0000 mg | Freq: Once | INTRAMUSCULAR | Status: AC
  Administered 2017-03-10: 40 mg via INTRA_ARTICULAR

## 2017-03-10 NOTE — Assessment & Plan Note (Addendum)
Patient has severe osteoarthritis of L shoulder with bone spurring causing pain and very limited mobility. He has regained some slight ROM with exercises but is still having difficulty functioning. CSI today for pain but discussed referral to orthopedics to consider L shoulder replacement surgery. Patient will consider and likely call back in February when he is ready to meet with Orthopedics.  Procedure:  Injection of Left shoulder Consent obtained and verified. Time-out conducted. Noted no overlying erythema, induration, or other signs of local infection. Skin prepped in a sterile fashion. Topical analgesic spray: Ethyl chloride. Completed without difficulty.  Posterior approach into the joint space. Meds: 1 cc solumedrol 40 and 4 cc lidocaine 1% Motion immediately improved suggesting accurate placement of the medication. Advised to call if fevers/chills, erythema, induration, drainage, or persistent bleeding.

## 2017-03-10 NOTE — Assessment & Plan Note (Signed)
R mid foot arthritis that was likely exacerbated by recent increased activity. Additional arch support added to orthotics and arch wrap placed today with immediate relief.

## 2017-03-10 NOTE — Progress Notes (Signed)
    Subjective:  Randall Hudson is a 76 y.o. male who presents for follow up on L shoulder pain and new onset R foot pain  HPI:  L shoulder pain Has chronic L shoulder pain with known severe osteoarthritis seen on MRI 11/14/15. Has been doing well on home exercises and slight increase in function but pain is very much limiting function. He has difficulty dressing himself as he cannot lift his L arm over his head or put behind his back. Would like CSI today to help with pain  R foot pain Having pain in the arch of his R foot for the last 6 weeks. Started off as a minor pain in the medial side of arch but after walking along beach. No injury or trauma. Has been wearing his orthotics    ROS: Per HPI  Objective:  Physical Exam: BP 100/64   Ht 5' 8.5" (1.74 m)   Wt 174 lb (78.9 kg)   BMI 26.07 kg/m   Gen: NAD, resting comfortably MSK:  L shoulder flexion and abduction limited to 90 degrees, extension limited to 5 degrees. Pain with empty can and hawkins test. Strength 5/5 on R upper extremity. No TTP over Randall Hudson joint or shoulder capsule. R foot with medial TMT rigidity but no redness. Toe flexion and extension normal. Normal ROM at ankle.  MRI review:  Severe osteoarthritis with irregularity of humeral head, large spurs, exopytic bone and cartilage, joint effusion.  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 He has regained some slight ROM with exercises but is still having difficulty functioning. CSI today for pain but discussed referral to orthopedics to consider L shoulder replacement surgery. Patient will consider and likely call back in February when he is ready to meet with Orthopedics.  Arthritis, midfoot R mid foot arthritis that was likely exacerbated by recent increased activity. Additional arch support added to orthotics and arch wrap placed today with immediate  relief.    Bufford Lope, DO PGY-2, [facility] 03/10/2017 2:28 PM  I observed and examined the patient with the resident and agree with assessment and plan.  Note reviewed and modified by me. Stefanie Libel, MD

## 2017-04-07 ENCOUNTER — Ambulatory Visit: Payer: Medicare Other | Admitting: Sports Medicine

## 2017-06-09 ENCOUNTER — Encounter: Payer: Self-pay | Admitting: Sports Medicine

## 2017-06-09 ENCOUNTER — Ambulatory Visit (INDEPENDENT_AMBULATORY_CARE_PROVIDER_SITE_OTHER): Payer: Medicare Other | Admitting: Sports Medicine

## 2017-06-09 VITALS — BP 112/82 | Ht 70.5 in | Wt 176.0 lb

## 2017-06-09 DIAGNOSIS — M19012 Primary osteoarthritis, left shoulder: Secondary | ICD-10-CM

## 2017-06-09 MED ORDER — METHYLPREDNISOLONE ACETATE 40 MG/ML IJ SUSP
40.0000 mg | Freq: Once | INTRAMUSCULAR | Status: AC
  Administered 2017-06-09: 40 mg via INTRA_ARTICULAR

## 2017-06-09 NOTE — Assessment & Plan Note (Signed)
Procedure:  Injection of Left shoulder Consent obtained and verified. Time-out conducted. Noted no overlying erythema, induration, or other signs of local infection. Skin prepped in a sterile fashion. Topical analgesic spray: Ethyl chloride. Completed without difficulty. Meds:1 cc solumedrol 40 plus 4 cc lidocaine 1% Pain immediately improved suggesting accurate placement of the medication. Advised to call if fevers/chills, erythema, induration, drainage, or persistent bleeding.  Stefanie Libel, MD  Cont ROM after 2 days  Consider consult with Dr Tamera Punt for shoulder surgery Motivated older M who may be good candidate

## 2017-06-09 NOTE — Progress Notes (Signed)
CC: Left shoulder pain  Known OA of left shoulder MRI 8/17 showed spurs and advanced OA ROM had worsened  CSI 03/10/17  Excellent pain relief within 48 hours For next 2 mos good pain relief ROM improved steadily iuntil past 2 weeks  Pain retiurning and requesting another injection  ROS No neck pain No weakness in left extremity or hand No radicular sxs  PE Older M in NAD BP 112/82   Ht 5' 10.5" (1.791 m)   Wt 176 lb (79.8 kg)   BMI 24.90 kg/m   ROM left shoulder is limited but improved Abduction to 80 deg Forward flexion to 110 deg Back scratch to L 2 ER is good Deltoid testing strong but causes pain RCT - strong in pain free positions Biceps testing mildly pinful on speeds

## 2017-06-09 NOTE — Patient Instructions (Signed)
We have scheduled you to see Dr. Malena Catholic for your left shoulder.  [facility] [place]  Appt: 06/24/17 at 9:30 am. Please arrive at 9 am to fill out paperwork.

## 2017-08-16 ENCOUNTER — Other Ambulatory Visit: Payer: Self-pay | Admitting: Family Medicine

## 2017-08-16 DIAGNOSIS — R51 Headache: Principal | ICD-10-CM

## 2017-08-16 DIAGNOSIS — R519 Headache, unspecified: Secondary | ICD-10-CM

## 2017-08-22 ENCOUNTER — Ambulatory Visit
Admission: RE | Admit: 2017-08-22 | Discharge: 2017-08-22 | Disposition: A | Discharge status: Home / Self Care | Payer: Medicare Other | Source: Ambulatory Visit | Attending: Family Medicine | Admitting: Family Medicine

## 2017-08-22 DIAGNOSIS — R519 Headache, unspecified: Secondary | ICD-10-CM

## 2017-08-22 DIAGNOSIS — R51 Headache: Principal | ICD-10-CM

## 2017-08-23 ENCOUNTER — Other Ambulatory Visit: Payer: Self-pay | Admitting: Internal Medicine

## 2017-08-23 DIAGNOSIS — G4452 New daily persistent headache (NDPH): Secondary | ICD-10-CM

## 2017-08-23 DIAGNOSIS — D352 Benign neoplasm of pituitary gland: Secondary | ICD-10-CM

## 2017-08-25 ENCOUNTER — Ambulatory Visit
Admission: RE | Admit: 2017-08-25 | Discharge: 2017-08-25 | Disposition: A | Discharge status: Home / Self Care | Payer: Medicare Other | Source: Ambulatory Visit | Attending: Internal Medicine | Admitting: Internal Medicine

## 2017-08-25 ENCOUNTER — Other Ambulatory Visit: Payer: Medicare Other

## 2017-08-25 DIAGNOSIS — D352 Benign neoplasm of pituitary gland: Secondary | ICD-10-CM

## 2017-08-25 DIAGNOSIS — G4452 New daily persistent headache (NDPH): Secondary | ICD-10-CM

## 2017-08-25 MED ORDER — GADOBENATE DIMEGLUMINE 529 MG/ML IV SOLN
9.0000 mL | Freq: Once | INTRAVENOUS | Status: AC | PRN
  Administered 2017-08-25: 9 mL via INTRAVENOUS

## 2017-08-26 ENCOUNTER — Other Ambulatory Visit: Payer: Medicare Other

## 2017-08-27 ENCOUNTER — Other Ambulatory Visit: Payer: Medicare Other

## 2017-10-04 ENCOUNTER — Emergency Department (HOSPITAL_COMMUNITY)
Admission: EM | Admit: 2017-10-04 | Discharge: 2017-10-04 | Disposition: A | Discharge status: Home / Self Care | Payer: Medicare Other | Attending: Emergency Medicine | Admitting: Emergency Medicine

## 2017-10-04 ENCOUNTER — Other Ambulatory Visit: Payer: Self-pay

## 2017-10-04 DIAGNOSIS — E119 Type 2 diabetes mellitus without complications: Secondary | ICD-10-CM | POA: Diagnosis not present

## 2017-10-04 DIAGNOSIS — R040 Epistaxis: Secondary | ICD-10-CM | POA: Diagnosis not present

## 2017-10-04 DIAGNOSIS — I10 Essential (primary) hypertension: Secondary | ICD-10-CM | POA: Diagnosis not present

## 2017-10-04 MED ORDER — LIDOCAINE-EPINEPHRINE-TETRACAINE (LET) SOLUTION
3.0000 mL | Freq: Once | NASAL | Status: AC
  Administered 2017-10-04: 3 mL via TOPICAL
  Filled 2017-10-04: qty 3

## 2017-10-04 NOTE — Discharge Instructions (Signed)
As we discussed, there are several techniques you can use to prevent or stop nosebleeds in the future.  Keep your nose moist either with saline spray several times a day or by applying a thin layer of Neosporin, bacitracin, or other antibiotic ointment to the inside of your nose once or twice a day.  If the bleeding starts up again, apply 1-2 sprays to each affected nostril of over-the-counter Afrin nasal spray (oxymetazoline).   Then squeeze your nose shut tightly and DO NOT PEEK for at least 15 minutes.  This will resolve most nosebleeds.  If you continue to have trouble after trying these techniques, or anything seems out of the ordinary or concerns you, please return tot he Emergency Department.

## 2017-10-04 NOTE — ED Triage Notes (Signed)
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 Patient states that he awoke to go to bathroom when his nose started bleeding. Currently very anxious.

## 2017-10-04 NOTE — ED Notes (Signed)
ED Provider at bedside. 

## 2017-10-04 NOTE — ED Provider Notes (Signed)
Emergency Department Provider Note   I have reviewed the triage vital signs and the nursing notes.   HISTORY  Chief Complaint Epistaxis   HPI Randall Hudson is a 77 y.o. male with PMH of DM, HTN, and HLD s/p transsphenoidal resection of the pituitary with ENT at [facility] on 09/12/17 since to the emergency department with spontaneous nosebleed.  The patient got up from bed to go to the bathroom when his nose suddenly began to bleed.  He describes blood coming from both nostrils.  He began to hold pressure but could not get the bleeding to stop so presented to the emergency department.  Since his pituitary resection he has had no epistaxis.  He denies any clear drainage from the nose.  He states he still has "plugs" in his nose from the surgery with plans to have them removed later this week.  He has been compliant with nose blowing precautions.  Denies any trauma.  He is not on anticoagulation.    Past Medical History:  Diagnosis Date  . Actinic keratosis   . Allergic rhinitis   . Bronchitis   . Degenerative joint disease   . Diabetes mellitus without complication ([place])   . Epididymitis   . Hyperlipemia   . Hypertension     Patient Active Problem List   Diagnosis Date Noted  . Osteoarthritis of left shoulder 03/10/2017  . 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 Pain of left great toe 01/09/2015  . Leg length inequality 01/09/2015  . Arthritis, midfoot 01/09/2015  . Abnormality of gait 01/09/2015    No past surgical history on file.  Allergies Patient has no known allergies.  No family history on file.  Social History Social History   Tobacco Use  . Smoking status: Never Smoker  Substance Use Topics  . Alcohol use: Yes  . Drug use: No    Review of Systems  Constitutional: No fever/chills Eyes: No visual changes. ENT: No sore throat. Positive nosebleed.  Cardiovascular: Denies chest pain. Respiratory: Denies shortness of  breath. Gastrointestinal: No abdominal pain.  No nausea, no vomiting.  No diarrhea.  No constipation. Genitourinary: Negative for dysuria. Musculoskeletal: Negative for back pain. Skin: Negative for rash. Neurological: Negative for focal weakness or numbness. Positive intermittent HA.   10-point ROS otherwise negative.  ____________________________________________   PHYSICAL EXAM:  VITAL SIGNS: ED Triage Vitals  Enc Vitals Group     BP 10/04/17 0427 (!) 157/100     Pulse Rate 10/04/17 0427 (!) 56     Resp 10/04/17 0427 (!) 22     Temp 10/04/17 0427 (!) 97.4 F (36.3 C)     Temp Source 10/04/17 0427 Oral     SpO2 10/04/17 0427 100 %     Weight 10/04/17 0426 168 lb (76.2 kg)     Height 10/04/17 0426 5' 10.5" (1.791 m)     Pain Score 10/04/17 0426 0   Constitutional: Alert and oriented. Well appearing and in no acute distress. Eyes: Conjunctivae are normal.  Head: Atraumatic. Nose: No congestion/rhinnorhea. Oozing from the left nostril mainly. Some oozing noted in along with anterior left nostril. No clear fluid.  Mouth/Throat: Mucous membranes are moist.  Oropharynx non-erythematous. Neck: No stridor.  Cardiovascular: Normal rate, regular rhythm. Good peripheral circulation. Grossly normal heart sounds.   Respiratory: Normal respiratory effort.  No retractions. Lungs CTAB. Gastrointestinal: Soft and nontender. No distention.  Musculoskeletal: No lower extremity tenderness nor edema. No gross deformities of extremities.  Neurologic:  Normal speech and language. No gross focal neurologic deficits are appreciated.  Skin:  Skin is warm, dry and intact. No rash noted.  ____________________________________________  ZOXWRUEAV  None ____________________________________________   PROCEDURES  Procedure(s) performed:   .Epistaxis Management Date/Time: 10/04/2017 5:26 AM Performed by: Margette Fast, MD Authorized by: Margette Fast, MD   Consent:    Consent obtained:   Verbal   Consent given by:  Patient   Risks discussed:  Bleeding, infection, nasal injury and pain   Alternatives discussed:  Alternative treatment Anesthesia (see MAR for exact dosages):    Anesthesia method:  Topical application   Topical anesthetic:  LET Procedure details:    Treatment site:  L anterior   Treatment method:  Silver nitrate   Treatment complexity:  Limited   Treatment episode: initial   Post-procedure details:    Assessment:  Bleeding stopped   Patient tolerance of procedure:  Tolerated well, no immediate complications   ____________________________________________   INITIAL IMPRESSION / ASSESSMENT AND PLAN / ED COURSE  Pertinent labs & imaging results that were available during my care of the patient were reviewed by me and considered in my medical decision making (see chart for details).  Patient presents to the emergency department for evaluation of epistaxis.  This appears to be an anterior nosebleed.  Very serious doubt that this is related to the patient's transsphenoidal pituitary resection 3 weeks ago.  This is his first such event.  He denies any leaking clear fluid.  He has had intermittent headaches but no fever/chills.  No trauma to the nose.  Plan to apply topical anesthetic and attempt to visualize the nose more thoroughly and possible provide cautery.   05:22 PM LET pledget removed after 20 minutes. No additional bleeding. I was able to observe a small area of faint oozing in the left anterior nostril. Silver nitrate applied with good bleeding control. Spoke with nurse on call with ENT at [facility]. They are seeing the patient on Wednesday in clinic. No indication at this time for nasal packing but they would caution against packing. Discussed epistaxis mgmt at home and ED return precautions.   At this time, I do not feel there is any life-threatening condition present. I have reviewed and discussed all results (EKG, imaging, lab, urine as appropriate), exam  findings with patient. I have reviewed nursing notes and appropriate previous records.  I feel the patient is safe to be discharged home without further emergent workup. Discussed usual and customary return precautions. Patient and family (if present) verbalize understanding and are comfortable with this plan.  Patient will follow-up with their primary care provider. If they do not have a primary care provider, information for follow-up has been provided to them. All questions have been answered.  ____________________________________________  FINAL CLINICAL IMPRESSION(S) / ED DIAGNOSES  Final diagnoses:  Left-sided epistaxis    MEDICATIONS GIVEN DURING THIS VISIT:  Medications  lidocaine-EPINEPHrine-tetracaine (LET) solution (3 mLs Topical Given by Other 10/04/17 0448)    Note:  This document was prepared using Dragon voice recognition software and may include unintentional dictation errors.  Nanda Quinton, MD Emergency Medicine    Long, [facility], MD 10/04/17 [phone]

## 2017-10-06 ENCOUNTER — Emergency Department (HOSPITAL_COMMUNITY)
Admission: EM | Admit: 2017-10-06 | Discharge: 2017-10-06 | Disposition: A | Discharge status: Home / Self Care | Payer: Medicare Other | Attending: Emergency Medicine | Admitting: Emergency Medicine

## 2017-10-06 ENCOUNTER — Encounter (HOSPITAL_COMMUNITY): Payer: Self-pay | Admitting: Emergency Medicine

## 2017-10-06 DIAGNOSIS — I10 Essential (primary) hypertension: Secondary | ICD-10-CM | POA: Diagnosis not present

## 2017-10-06 DIAGNOSIS — T402X1A Poisoning by other opioids, accidental (unintentional), initial encounter: Secondary | ICD-10-CM | POA: Insufficient documentation

## 2017-10-06 DIAGNOSIS — E119 Type 2 diabetes mellitus without complications: Secondary | ICD-10-CM | POA: Insufficient documentation

## 2017-10-06 DIAGNOSIS — Z79899 Other long term (current) drug therapy: Secondary | ICD-10-CM | POA: Insufficient documentation

## 2017-10-06 DIAGNOSIS — T50901A Poisoning by unspecified drugs, medicaments and biological substances, accidental (unintentional), initial encounter: Secondary | ICD-10-CM

## 2017-10-06 LAB — CBG MONITORING, ED: GLUCOSE-CAPILLARY: 87 mg/dL (ref 70–99)

## 2017-10-06 NOTE — ED Provider Notes (Signed)
[facility] EMERGENCY DEPARTMENT Provider Note   CSN: [id number] Arrival date & time: 10/06/17  0920     History   Chief Complaint Chief Complaint  Patient presents with  . Medication Reaction    HPI Randall Hudson is a 77 y.o. male.  The history is provided by the patient. No language interpreter was used.   Randall Hudson is a 77 y.o. male who presents to the Emergency Department complaining of accidental overdose. This morning at 820 he took three 5 mg oxycodone tablets instead of his hydrocortisone. Shortly after taking the medication he began to feel drowsy. No additional complaints. He denies any fevers, headache, chest pain, difficulty breathing, nausea, vomiting. The medication had been prescribed after pituitary surgery, performed on June 10. He did not take his hydrocortisone this morning as prescribed. Symptoms are mild to moderate and constant. Past Medical History:  Diagnosis Date  . Actinic keratosis   . Allergic rhinitis   . Bronchitis   . Degenerative joint disease   . Diabetes mellitus without complication ([place])   . Epididymitis   . Hyperlipemia   . Hypertension     Patient Active Problem List   Diagnosis Date Noted  . Osteoarthritis of left shoulder 03/10/2017  . 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 Pain of left great toe 01/09/2015  . Leg length inequality 01/09/2015  . Arthritis, midfoot 01/09/2015  . Abnormality of gait 01/09/2015    History reviewed. No pertinent surgical history.      Home Medications    Prior to Admission medications   Medication Sig Start Date End Date Taking? Authorizing Provider  amoxicillin-clavulanate (AUGMENTIN) 875-125 MG tablet  01/13/15   [provider]  benazepril (LOTENSIN) 10 MG tablet Take 10 mg by mouth daily.    [provider]  cefdinir (OMNICEF) 300 MG capsule  11/11/14   [provider]  diclofenac sodium (VOLTAREN) 1 % GEL Apply 2 g  topically 4 (four) times daily. Patient not taking: Reported on 06/09/2017 11/05/15   Stefanie Libel, MD  dorzolamide-timolol (COSOPT) 22.3-6.8 MG/ML ophthalmic solution Place 1 drop into the right eye 2 (two) times daily.  09/27/14   [provider]  DUREZOL 0.05 % EMUL Place 1 drop into the right eye 2 (two) times daily.  09/27/14   [provider]  hydrochlorothiazide (HYDRODIURIL) 25 MG tablet Take 12.5 mg by mouth daily.    [provider]  ibuprofen (ADVIL,MOTRIN) 200 MG tablet Take 400 mg by mouth every 8 (eight) hours as needed for moderate pain.    [provider]  Multiple Vitamins-Minerals (PRESERVISION/LUTEIN PO) Take 1 tablet by mouth 2 (two) times daily.    [provider]  PROAIR HFA 108 [phone] Base) MCG/ACT inhaler  01/05/16   [provider]  tamsulosin (FLOMAX) 0.4 MG CAPS capsule  11/14/15   [provider]  Vitamin D, Cholecalciferol, 1000 UNITS CAPS Take 1 tablet by mouth daily.    [provider]    Family History History reviewed. No pertinent family history.  Social History Social History   Tobacco Use  . Smoking status: Never Smoker  . Smokeless tobacco: Never Used  Substance Use Topics  . Alcohol use: Yes  . Drug use: No     Allergies   Patient has no known allergies.   Review of Systems Review of Systems  All other systems reviewed and are negative.    Physical Exam Updated Vital Signs BP Marland Kitchen)  145/80   Pulse (!) 48   Temp 98.2 F (36.8 C) (Oral)   Resp 10   SpO2 100%   Physical Exam  Constitutional: He is oriented to person, place, and time. He appears well-developed and well-nourished.  HENT:  Head: Normocephalic and atraumatic.  Cardiovascular: Normal rate and regular rhythm.  bradycardic  Pulmonary/Chest: Effort normal and breath sounds normal. No respiratory distress.  Abdominal: Soft. There is no tenderness. There is no rebound and no guarding.  Musculoskeletal: He exhibits  no edema or tenderness.  Neurological: He is alert and oriented to person, place, and time.  Skin: Skin is warm and dry.  Psychiatric: He has a normal mood and affect. His behavior is normal.  Nursing note and vitals reviewed.     ED Treatments / Results  Labs (all labs ordered are listed, but only abnormal results are displayed) Labs Reviewed  CBG MONITORING, ED    EKG EKG Interpretation  Date/Time:  Thursday October 06 2017 10:26:11 EDT Ventricular Rate:  53 PR Interval:    QRS Duration: 149 QT Interval:  493 QTC Calculation: 463 R Axis:   -44 Text Interpretation:  Sinus rhythm Borderline short PR interval RBBB and LAFB No significant change since last tracing Confirmed by Quintella Reichert [id number]) on 10/06/2017 10:28:31 AM Also confirmed by Quintella Reichert [id number]), editor Lynder Parents [phone])  on 10/06/2017 12:34:35 PM   Radiology No results found.  Procedures Procedures (including critical care time)  Medications Ordered in ED Medications - No data to display   Initial Impression / Assessment and Plan / ED Course  I have reviewed the triage vital signs and the nursing notes.  Pertinent labs & imaging results that were available during my care of the patient were reviewed by me and considered in my medical decision making (see chart for details).     Patient here for evaluation following accidental overdose on oxycodone. He is alert and well appearing on initial evaluation. He was observed in the emergency department for five hours post ingestion. He had no hypoxia or significant symptoms. On repeat assessment he is feeling improved. Plan to discharge home with outpatient follow-up and return precautions.  Final Clinical Impressions(s) / ED Diagnoses   Final diagnoses:  Accidental overdose, initial encounter    ED Discharge Orders    None       Quintella Reichert, MD 10/06/17 1358

## 2017-10-06 NOTE — ED Triage Notes (Signed)
Pt to ER after accidentally taking 3 oxycodone when he thought he was taking his hydrocortisone. Pt ambulatory. Denies dizziness. Is here because "I haven't taken this medication since I left the hospital 3 weeks ago and I'm sleepy." VSS.

## 2018-06-25 IMAGING — CT CT HEAD W/O CM
1 series · 15 of 30 positions shown, 19 images · non-contrast
Comparison: None available currently.

CLINICAL DATA: Posttraumatic headache after fall.

EXAM:
CT HEAD WITHOUT CONTRAST
TECHNIQUE: Contiguous axial images were obtained from the base of the skull
through the vertex without intravenous contrast.

[Series 2: head w/(date) · axial · 0.41mm/px · z∈[+1085,+1230]mm · 15 of 33 slices shown, 19 images]
[im 2/33  brain]
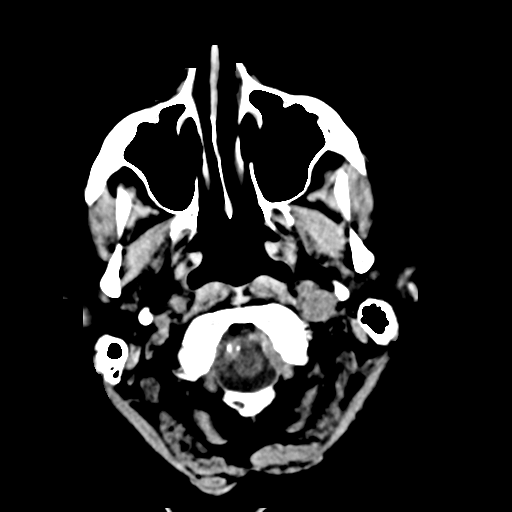
[im 2/33  bone]
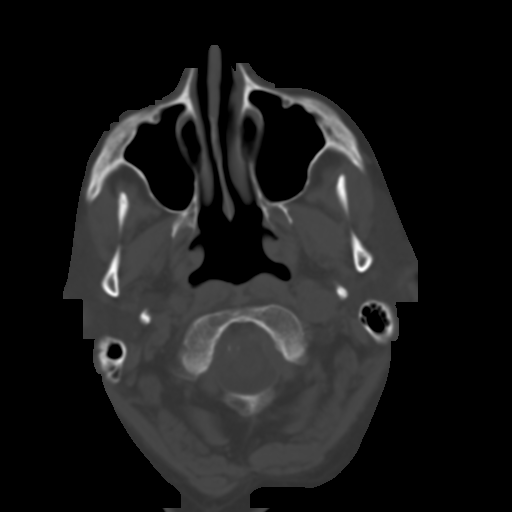
[im 4/33  brain]
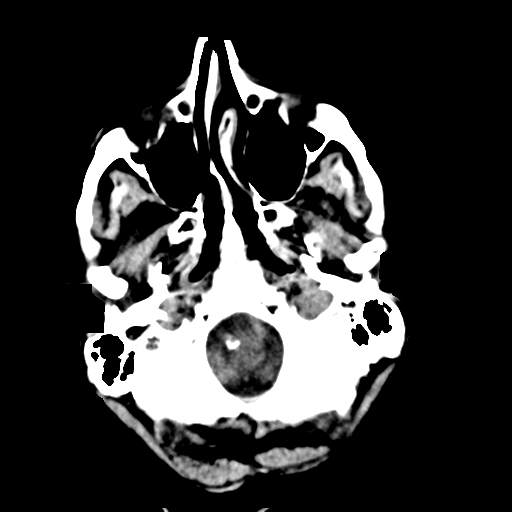
[im 6/33  brain]
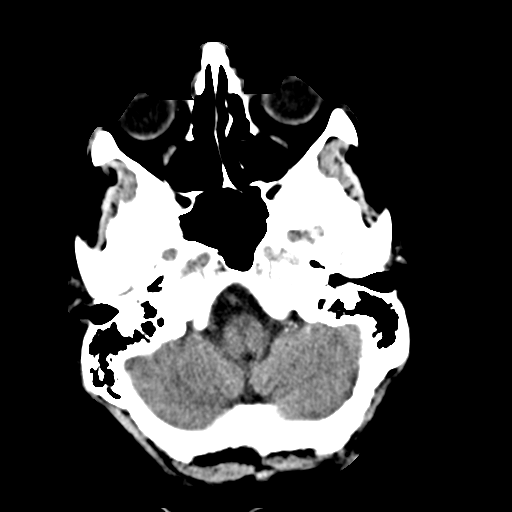
[im 8/33  brain]
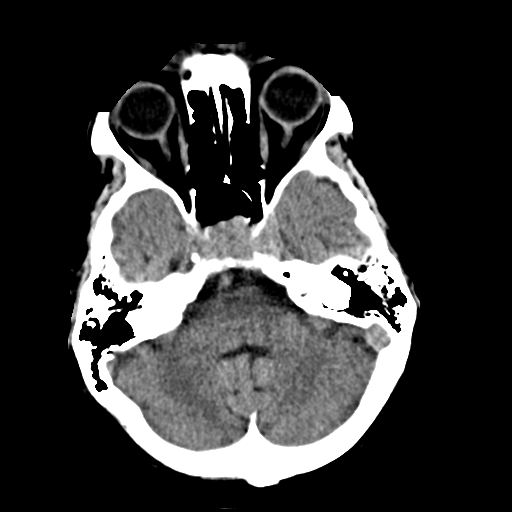
[im 10/33  brain]
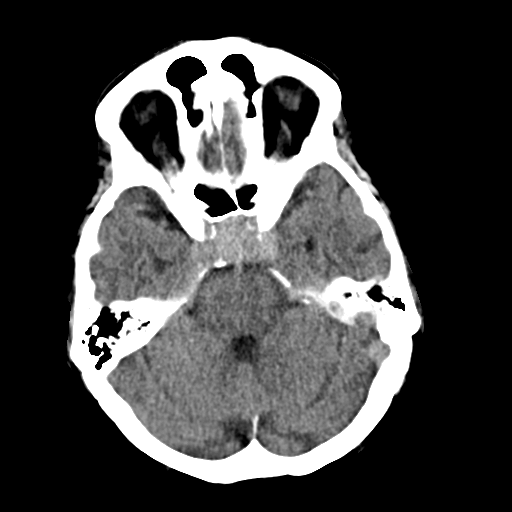
[im 10/33  bone]
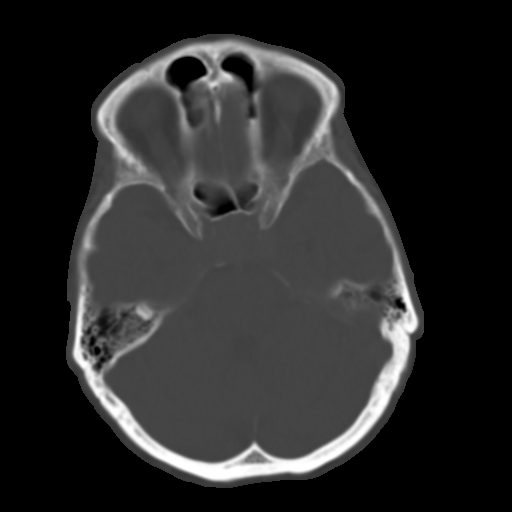
[im 13/33  brain]
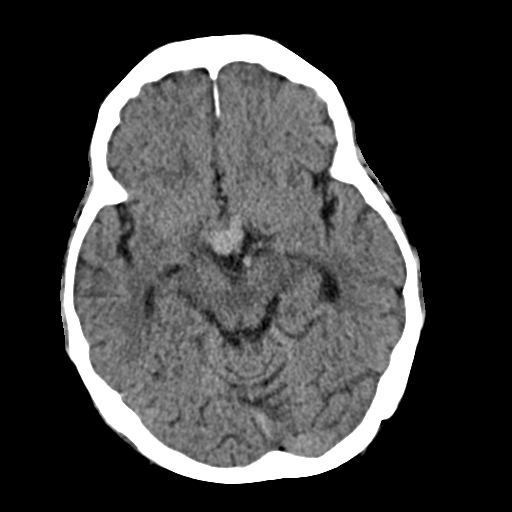
[im 15/33  brain]
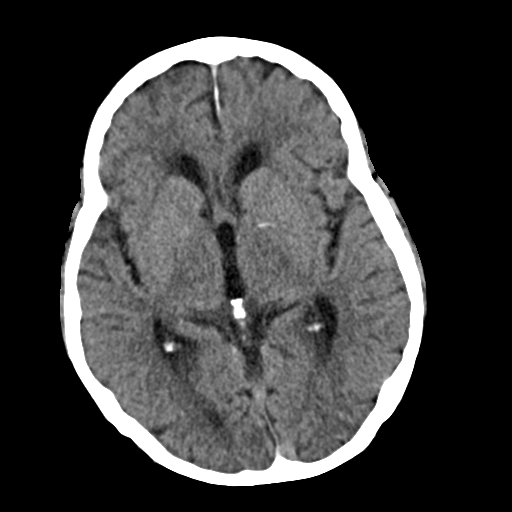
[im 17/33  brain]
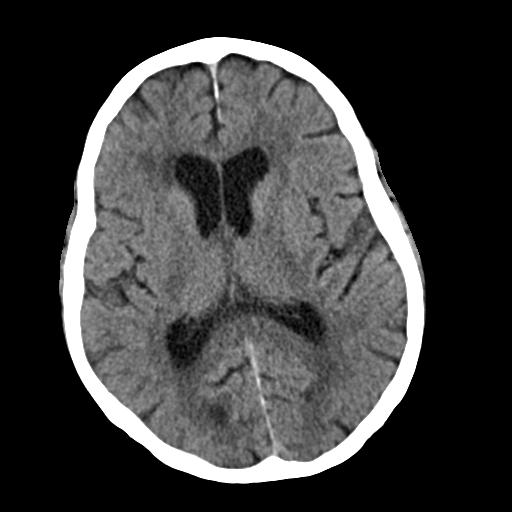
[im 18/33  brain]
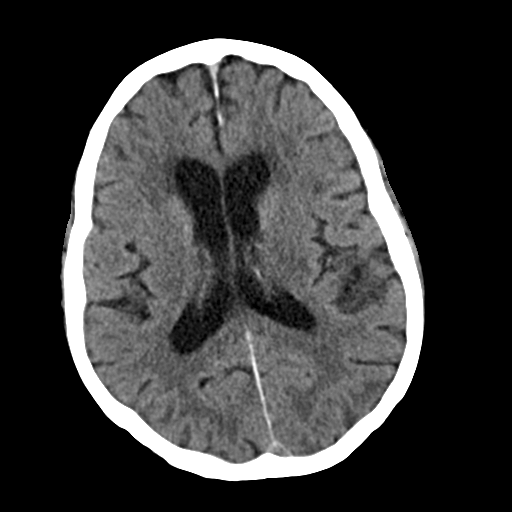
[im 18/33  bone]
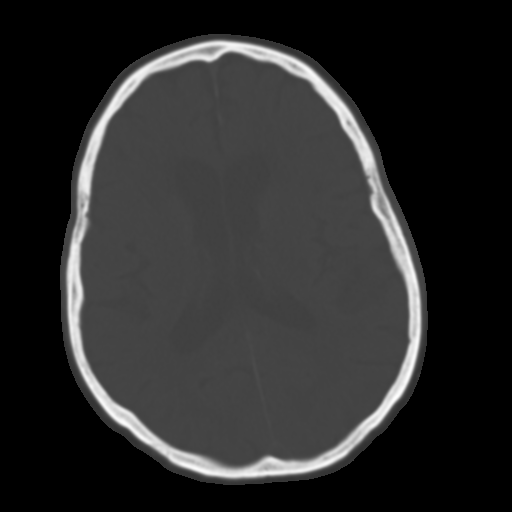
[im 20/33  brain]
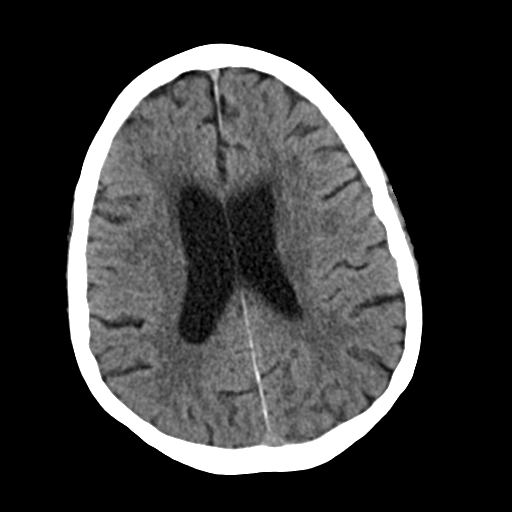
[im 23/33  brain]
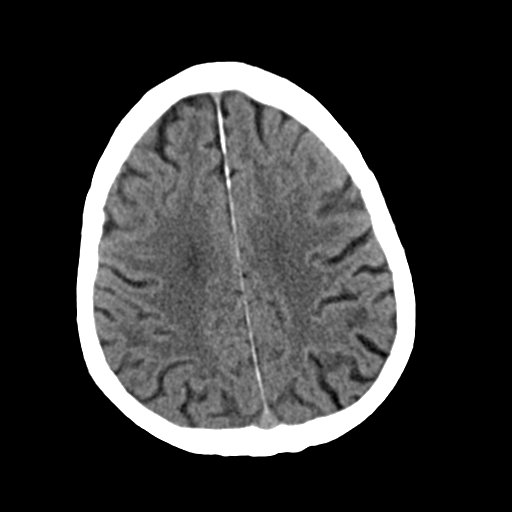
[im 25/33  brain]
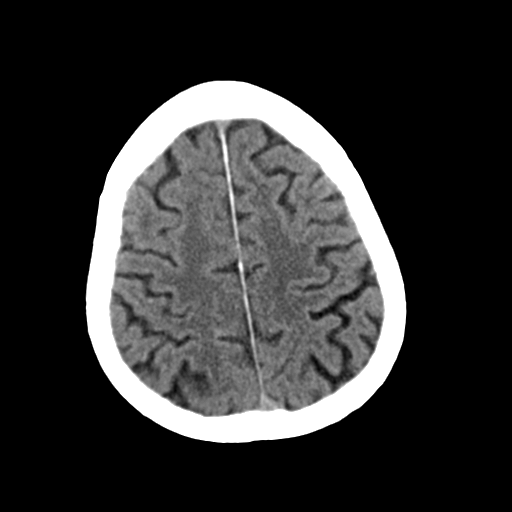
[im 27/33  brain]
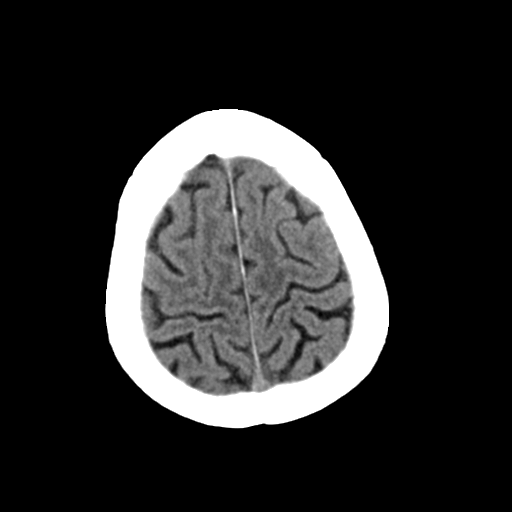
[im 27/33  bone]
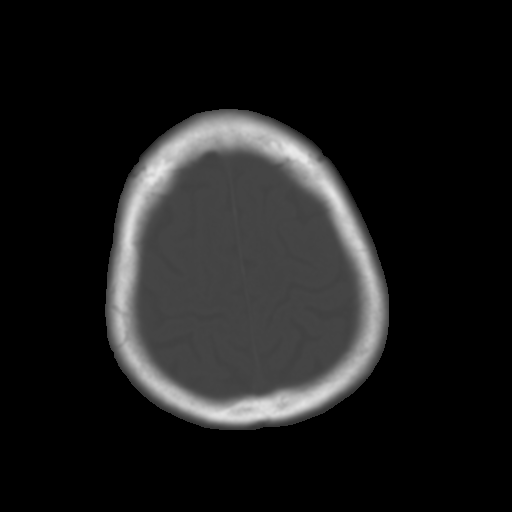
[im 29/33  brain]
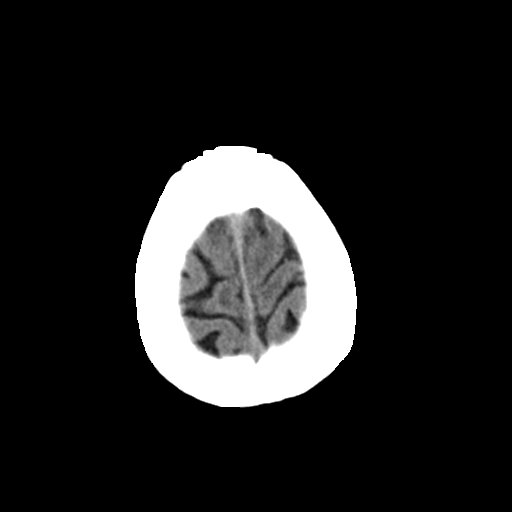
[im 31/33  brain]
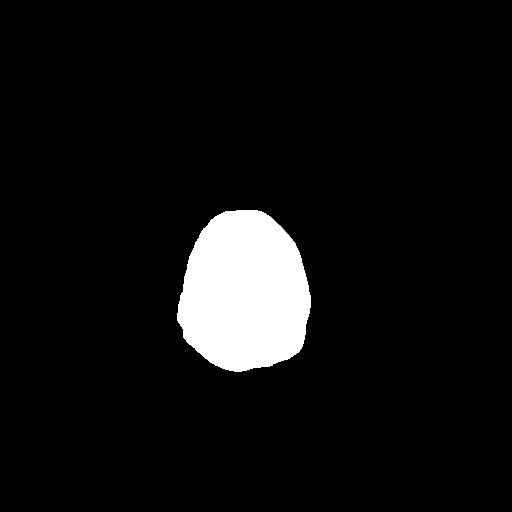

[15 of 30 positions shown; findings below may reference images not displayed]

FINDINGS: Brain: Mild chronic ischemic white matter disease is noted. Large
pituitary mass is noted, with suprasellar extension measuring 17 x
17 mm. This has a snowman configuration and is most consistent with
large pituitary macroadenoma. MRI of the pituitary gland is
recommended for further evaluation. Ventricular size is within
normal limits. No hemorrhage or acute infarction is noted. No
midline shift is noted.

Vascular: No hyperdense vessel or unexpected calcification.

Skull: Normal. Negative for fracture or focal lesion.

Sinuses/Orbits: No acute finding.

Other: None.
IMPRESSION: Large pituitary mass is noted with suprasellar extension most
consistent with large pituitary macroadenoma. MRI of the pituitary
gland is recommended for further evaluation.

Mild chronic ischemic white matter disease.

## 2018-10-31 ENCOUNTER — Ambulatory Visit: Payer: Self-pay

## 2018-10-31 ENCOUNTER — Encounter: Payer: Self-pay | Admitting: Sports Medicine

## 2018-10-31 ENCOUNTER — Other Ambulatory Visit: Payer: Self-pay

## 2018-10-31 ENCOUNTER — Ambulatory Visit (INDEPENDENT_AMBULATORY_CARE_PROVIDER_SITE_OTHER): Payer: Medicare Other | Admitting: Sports Medicine

## 2018-10-31 VITALS — BP 144/80 | Ht 70.5 in | Wt 198.0 lb

## 2018-10-31 DIAGNOSIS — M216X1 Other acquired deformities of right foot: Secondary | ICD-10-CM

## 2018-10-31 DIAGNOSIS — M21961 Unspecified acquired deformity of right lower leg: Secondary | ICD-10-CM | POA: Diagnosis not present

## 2018-10-31 DIAGNOSIS — M21962 Unspecified acquired deformity of left lower leg: Secondary | ICD-10-CM

## 2018-10-31 DIAGNOSIS — M205X2 Other deformities of toe(s) (acquired), left foot: Secondary | ICD-10-CM

## 2018-10-31 DIAGNOSIS — M19012 Primary osteoarthritis, left shoulder: Secondary | ICD-10-CM | POA: Diagnosis not present

## 2018-10-31 DIAGNOSIS — M216X2 Other acquired deformities of left foot: Secondary | ICD-10-CM

## 2018-10-31 NOTE — Progress Notes (Signed)
PCP: Prince Solian, MD  Subjective:   HPI: Patient is a 78 y.o. male here for evaluation of left shoulder pain as well as custom orthotics.  Patient's left shoulder pain is been present for several years now.  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  Patient was previously referred for a joint replacement and met with the orthopedic surgeon.  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  His hospital course was complicated by severe hyponatremia.  Following this hospitalization patient is now wanting to avoid surgery if possible.  He canceled his scheduled reverse shoulder replacement and is following up to discuss his other options.    Patient has received corticosteroid injections of the glenohumeral joint in the past with minimal benefit.  He notes he has minimal pain with movement of his shoulder but has difficulty with overhead activities due to limited range of motion.  His pain does not keep him up at night.  The pain does not radiate.  He has no pain in his neck.  He notes some improvement with over-the-counter Tylenol.  Patient also follows up for custom orthotics.  He has had custom orthotics made for him in the past however his current orthotics are now wearing out.  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  He notes with his orthotics he has very minimal pain in his feet.  He denies any numbness or tingling in his feet.  He denies any skin changes feet.   Review of Systems: See HPI above.  Past Medical History:  Diagnosis Date  . Actinic keratosis   . Allergic rhinitis   . Bronchitis   . Degenerative joint disease   . Diabetes mellitus without complication ([place])   . Epididymitis   . Hyperlipemia   . Hypertension     Current Outpatient Medications on File Prior to Visit  Medication Sig Dispense Refill   . amoxicillin-clavulanate (AUGMENTIN) 875-125 MG tablet     . benazepril (LOTENSIN) 10 MG tablet Take 10 mg by mouth daily.    . cefdinir (OMNICEF) 300 MG capsule     . diclofenac sodium (VOLTAREN) 1 % GEL Apply 2 g topically 4 (four) times daily. (Patient not taking: Reported on 06/09/2017) 2 Tube 2  . dorzolamide-timolol (COSOPT) 22.3-6.8 MG/ML ophthalmic solution Place 1 drop into the right eye 2 (two) times daily.     . DUREZOL 0.05 % EMUL Place 1 drop into the right eye 2 (two) times daily.     . hydrochlorothiazide (HYDRODIURIL) 25 MG tablet Take 12.5 mg by mouth daily.    Marland Kitchen ibuprofen (ADVIL,MOTRIN) 200 MG tablet Take 400 mg by mouth every 8 (eight) hours as needed for moderate pain.    . Multiple Vitamins-Minerals (PRESERVISION/LUTEIN PO) Take 1 tablet by mouth 2 (two) times daily.    Marland Kitchen PROAIR HFA 108 (90 Base) MCG/ACT inhaler     . tamsulosin (FLOMAX) 0.4 MG CAPS capsule     . Vitamin D, Cholecalciferol, 1000 UNITS CAPS Take 1 tablet by mouth daily.     No current facility-administered medications on file prior to visit.     History reviewed. No pertinent surgical history.  No Known Allergies  Social History   Socioeconomic History  . Marital status: Married    Spouse name: Not on file  . Number of children: Not on file  .  Years of education: Not on file  . Highest education level: Not on file  Occupational History  . Not on file  Social Needs  . Financial resource strain: Not on file  . Food insecurity    Worry: Not on file    Inability: Not on file  . Transportation needs    Medical: Not on file    Non-medical: Not on file  Tobacco Use  . Smoking status: Never Smoker  . Smokeless tobacco: Never Used  Substance and Sexual Activity  . Alcohol use: Yes  . Drug use: No  . Sexual activity: Not on file  Lifestyle  . Physical activity    Days per week: Not on file    Minutes per session: Not on file  . Stress: Not on file  Relationships  . Social Product manager on phone: Not on file    Gets together: Not on file    Attends religious service: Not on file    Active member of club or organization: Not on file    Attends meetings of clubs or organizations: Not on file    Relationship status: Not on file  . Intimate partner violence    Fear of current or ex partner: Not on file    Emotionally abused: Not on file    Physically abused: Not on file    Forced sexual activity: Not on file  Other Topics Concern  . Not on file  Social History Narrative  . Not on file    History reviewed. No pertinent family history.      Objective:  Physical Exam: BP (!) 144/80   Ht 5' 10.5" (1.791 m)   Wt 198 lb (89.8 kg)   BMI 28.01 kg/m  Gen: NAD, comfortable in exam room Lungs: Breathing comfortably on room air Ankle/Foot Exam Right foot -Inspection: valgus shift of great toe. Calcaneal valgus deformity. Pronation of left ankle -ROM: Normal ankle ROM. Normal ROM of great toe -Limb neurovascularly intact  Left foot -Inspection: ankle in pronation -ROM: reduced ROM with great toe limited to 30 degrees dorsiflexion and 20 degrees plantar flexion -Limb neurovascularly intact   Shoulder Exam Left -Inspection: No discoloration, no deformity -Palpation: No tenderness to palpation -ROM (active): Abduction: 80 degrees; Forward Flexion: 100 degrees; Internal Rotation: T10 -ROM (Passive): Abduction: 150 degrees; Forward Flexion: 160 degrees. Joint crepitus felt with movement of shoulder -Strength: Abduction: 5/5; Forward Flexion: 5/5; Internal Rotation: 5/5; External Rotation: 5/5 -Special Tests: Hawkins: positive; Neers: Negative; Jobs: Negative; O'briens: Negative; Speeds: Negative; Yergasons: Negative -Limb neurovascularly intact  Contralateral Shoulder -Inspection: No discoloration, no deformity -Palpation: No tenderness to palpation -ROM (active): Abduction: 180 degrees; Forward Flexion: 180 degrees; Internal Rotation: T10 -ROM (Passive):  Abduction: 180 degrees; Forward Flexion: 180 degrees; Internal Rotation: T10 -Strength: Abduction: 5/5; Forward Flexion: 5/5; Internal Rotation: 5/5; External Rotation: 5/5 -Limb neurovascularly intact    Assessment & Plan:  Patient is a 78 y.o. male here for evaluation of left shoulder pain as well as custom orthotics.   1. 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 Right foot with valgus shift of great toe and calcaneal valgus deformity. Both ankles in over-pronation with standing -Patient was fitted for a : standard, cushioned, semi-rigid orthotic. The orthotic was heated and afterward the patient stood on the orthotic blank positioned on the orthotic stand. The patient was positioned in subtalar neutral position and 10 degrees of ankle dorsiflexion in a weight bearing stance. After completion of molding, a  stable base was applied to the orthotic blank. The blank was ground to a stable position for weight bearing. Base: red orthotic with blue medium EVA base Additional orthotic padding: L pad added to left orthotic for padding of the first MTP joint 2. Left glenohumeral osteoarthritis -Patient no longer interested in joint replacement but would rather pursue conservative measures -Patient with minimal improvement of corticosteroid injection of the shoulder in the past and he is currently declining injection today - Patient was given home range of motion and strengthening exercises for his left shoulder.  He will continue to work on those. -We will consider Toradol injection into the glenohumeral joint and subsequent visit if patient's pain worsens - Patient was counseled that if he has worsening range of motion, worsening pain, or decides to proceed with a joint replacement then a new referral to Ortho can be made at his request  I observed and examined the patient with Dr. Sheppard Coil and agree with assessment and plan.  Note reviewed and modified by me. Ila Mcgill, MD

## 2019-04-19 ENCOUNTER — Ambulatory Visit: Payer: Medicare Other | Attending: Internal Medicine

## 2020-02-21 ENCOUNTER — Ambulatory Visit: Payer: Self-pay

## 2020-02-21 ENCOUNTER — Other Ambulatory Visit: Payer: Self-pay

## 2020-02-21 ENCOUNTER — Ambulatory Visit: Payer: Medicare Other | Admitting: Sports Medicine

## 2020-02-21 VITALS — BP 118/76 | Ht 70.75 in | Wt 198.0 lb

## 2020-02-21 DIAGNOSIS — M25562 Pain in left knee: Secondary | ICD-10-CM | POA: Diagnosis not present

## 2020-02-21 DIAGNOSIS — G8929 Other chronic pain: Secondary | ICD-10-CM | POA: Insufficient documentation

## 2020-02-21 DIAGNOSIS — M19079 Primary osteoarthritis, unspecified ankle and foot: Secondary | ICD-10-CM

## 2020-02-21 NOTE — Patient Instructions (Signed)
It was great to see you today!  -For your knee please do the exercises we showed you.  -Do the straight leg raises everyday. Do the other 2 exercises at least 2-3 times a week. -Wear the compression sleeve with activity. -You can start back hiking but do half of what you would normally do in the beginning. -Ease back into hiking over the next 4-6 weeks. -We made you new custom orthotics today. Please call us if you have any problems with them.

## 2020-02-21 NOTE — Progress Notes (Signed)
Office Visit Note   Patient: Randall Hudson           Date of Birth: 06-04-1940           MRN: [id number] Visit Date: 02/21/2020 Requested by: Prince Solian, MD [address] [place],  [place] [address] PCP: Prince Solian, MD  Subjective: CC: Left Knee pain  HPI:  Patient is a pleasant 79 year old male presenting to clinic today with concerns of left knee pain for the past 6 weeks.  He denies any acute trauma that he can recall prior to the onset of his pain.  He says that, approximately 1 week ago, he felt as though his left knee was swollen when compared to the right.  States that his knee does not catch or lock, but it has given out on him.  He is a Museum/gallery curator, and he would like to be able to continue hiking, but has been unable to do so because he is worried that he will hurt himself further with this injury.  He says that he bought himself a compression sleeve from [employer], but is not sure that it is helping. He has worn custom orthotics in the past, and is wondering if he can get a new pair-as he hopes that this will be helpful.              ROS:   All other systems were reviewed and are negative.  Objective: Vital Signs: BP 118/76   Ht 5' 10.75" (1.797 m)   Wt 198 lb (89.8 kg)   BMI 27.81 kg/m   Physical Exam:  General:  Alert and oriented, in no acute distress. Pulm:  Breathing unlabored. Psy:  Normal mood, congruent affect. Skin: Left knee with no bruising, rashes, or erythema.  Overlying skin intact. Left knee exam:  General: Normal gait Standing exam: No varus or valgus deformity of the knee. No pes planus or pes cavus.  Trendelenburg: No evidence of hip stabilizer weakness  Seated Exam:  Minimal patellar crepitus, Negative J-Sign.   Palpation: Endorses mild tenderness over medial joint line, however no tenderness over lateral joint line.  Mild tenderness with compression of patella.  No tenderness with compression of patellar tendon or patellar facets.    Supine  exam: Moderate effusion, normal patellar mobility.   Ligamentous Exam:  No pain or laxity with anterior/posterior drawer.  No obvious Sag.  No pain or laxity with varus/valgus stress across the knee.   Meniscus:  McMurray with no pain or deep clicking.  Thessaly negative.   Strength: Hip flexion (L1), Hip Aduction (L2), Knee Extension (L3) are 5/5 Bilaterally Foot Inversion (L4), Dorsiflexion (L5), and Eversion (S1) 5/5 Bilaterally  Sensation: Intact to light touch medial and lateral aspects of lower extremities, and lateral, dorsal, and medial aspects of foot.    Imaging: Extremity ultrasound-left knee:  Mild effusion visible in suprapatellar pouch. Quadriceps tendon appears intact. Patella tendon visualized in long and short axis, appears intact without surrounding fluid. Medial meniscus with surrounding effusion, however no obvious cleft or extrusion. Lateral meniscus appears intact with no surrounding effusion. Medial and lateral joint spaces appear well-preserved. Trochlear groove with appropriate joint spacing beneath the patella.  Does have calcification over superior aspect of patellar cartilage.  Impression: Mild effusion in suprapatellar pouch as well as around medial aspect of knee, with well-preserved joint spaces.  Possible early degenerative changes in patellofemoral compartment.    Ultrasound and interpretation by Dr. Elouise Munroe and Wolfgang Phoenix. Fields, MD   Assessment &  Plan: 79 year old male presenting to clinic with 1 month of left knee pain.  Patient is an avid hiker, and is worried about having to sacrifice this hobby due to his symptoms.  Examination and ultrasound findings as above, which was reassuring against significant degenerative disease-as joint space appears very well preserved on ultrasound today.  Suspect cartilage bruising from recent hiking activities prior to the onset of his symptoms.  Likely due to downhill movement. -Patient was fit for new custom  orthotics.  -Patient was educated on home exercises for quad stabilization to offload his knee and promote healing. -Given significant effusion, patient was fit with appropriate compression sleeve today. -Encouraged to ease back into hiking as his symptoms allow. -Return to clinic in 6 weeks for reevaluation if his pain has not resolved by this time. -Patient expresses understanding, and has no further questions or concerns today.   With history of midfoot arthritis and knee pain we will make a new pair of custom orthotics to try to allow him to continue hiking.  Procedures:  Ultrasound of left knee Custom orthotics  I observed and examined the patient with Dr. Elouise Munroe and agree with assessment and plan.  Note reviewed and modified by me. Ila Mcgill, MD

## 2020-02-21 NOTE — Assessment & Plan Note (Signed)
Custom orthotics have helped New orthotics prepared today  Patient was fitted for a : standard, cushioned, semi-rigid orthotic. The orthotic was heated and afterward the patient stood on the orthotic blank positioned on the orthotic stand. The patient was positioned in subtalar neutral position and 10 degrees of ankle dorsiflexion in a weight bearing stance. After completion of molding, a stable base was applied to the orthotic blank. The blank was ground to a stable position for weight bearing. Size: 10 Red Med. EVA Base: Blue medium density EVA Posting: none Additional orthotic padding: none  Post preparation - gait appears neutral He has excellent comfort and less pain

## 2021-03-12 ENCOUNTER — Ambulatory Visit: Payer: Medicare Other | Admitting: Sports Medicine

## 2021-03-12 VITALS — BP 132/76 | Ht 70.5 in | Wt 196.0 lb

## 2021-03-12 DIAGNOSIS — M19079 Primary osteoarthritis, unspecified ankle and foot: Secondary | ICD-10-CM

## 2021-03-12 DIAGNOSIS — G8929 Other chronic pain: Secondary | ICD-10-CM

## 2021-03-12 DIAGNOSIS — M25562 Pain in left knee: Secondary | ICD-10-CM | POA: Diagnosis not present

## 2021-03-12 NOTE — Assessment & Plan Note (Signed)
Patient was fitted for a : standard, cushioned, semi-rigid orthotic. The orthotic was heated and afterward the patient stood on the orthotic blank positioned on the orthotic stand. The patient was positioned in subtalar neutral position and 10 degrees of ankle dorsiflexion in a weight bearing stance. After completion of molding, a stable base was applied to the orthotic blank. The blank was ground to a stable position for weight bearing. Size: 10 Base: blue EVA Posting: none Additional orthotic padding: b/l medial heel wedge  Total time spent with the patient was 30 minutes with greater than 50% of the time spent in face-to-face consultation discussing orthotic construction, instruction, and sitting. Gait was neutral with orthotics in place. Patient found them to be comfortable. Follow-up as needed.

## 2021-03-12 NOTE — Assessment & Plan Note (Signed)
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  Given hip rotation, hip abduction, side steps, and cross side steps for HEP.  F/U prn.

## 2021-03-12 NOTE — Progress Notes (Signed)
   Randall Hudson is a 80 y.o. male who presents to [facility] today for the following:  B/L midfoot arthritis Has had prior orthotics, last made 1 year ago Presents today for new orthotics for a new pair of shoes  Left Knee Pain Chronic  Off and on Hurts after walking 4 miles, but able to walk 4 miles without difficulty Otherwise doing well  PMH reviewed.  ROS as above. Medications reviewed.  Exam:  BP 132/76   Ht 5' 10.5" (1.791 m)   Wt 196 lb (88.9 kg)   BMI 27.73 kg/m  Gen: Well NAD MSK:  Bilateral Feet:  Inspection:  Right foot outward deviation > left, b/l navicular drop, R subtalar joint valgus on right with b/l collapse of transverse and longitudinal arch Neurovascular: N/V intact distally in the lower extremity b/l Special tests: Normal calcaneal motion with heel raise  Left Knee: - Inspection: no gross deformity b/l. No swelling/effusion, erythema or bruising b/l. Skin intact - Palpation: no TTP b/l - ROM: full active ROM with flexion and extension in knee, -3 extension on left, hip ROM with 60 degrees IR on right, 45 degrees on left - Strength: 5/5 strength b/l aside from 4/5 strength left hip abduction - Neuro/vasc: NV intact distally b/l - Special Tests: - LIGAMENTS: negative anterior and posterior drawer, negative Lachman's, no MCL or LCL laxity  -- MENISCUS: negative McMurray's -- PF JOINT: nml patellar mobility bilaterally    No results found.   Assessment and Plan: 1) Arthritis, midfoot Patient was fitted for a : standard, cushioned, semi-rigid orthotic. The orthotic was heated and afterward the patient stood on the orthotic blank positioned on the orthotic stand. The patient was positioned in subtalar neutral position and 10 degrees of ankle dorsiflexion in a weight bearing stance. After completion of molding, a stable base was applied to the orthotic blank. The blank was ground to a stable position for weight bearing. Size: 10 Base: blue  EVA Posting: none Additional orthotic padding: b/l medial heel wedge  Total time spent with the patient was 30 minutes with greater than 50% of the time spent in face-to-face consultation discussing orthotic construction, instruction, and sitting. Gait was neutral with orthotics in place. Patient found them to be comfortable. Follow-up as needed.   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  Given hip rotation, hip abduction, side steps, and cross side steps for HEP.  F/U prn.   [place] Constable, D.O.  PGY-4 [facility]  03/12/2021 2:22 PM  I observed and examined the patient with the [facility] resident and agree with assessment and plan.  Note reviewed and modified by me. Ila Mcgill, MD

## 2021-11-30 ENCOUNTER — Encounter: Payer: Self-pay | Admitting: Cardiology

## 2021-11-30 ENCOUNTER — Ambulatory Visit: Payer: Medicare Other | Admitting: Cardiology

## 2021-11-30 VITALS — BP 138/81 | HR 60 | Temp 97.2°F | Resp 16 | Ht 70.0 in | Wt 196.0 lb

## 2021-11-30 DIAGNOSIS — R0609 Other forms of dyspnea: Secondary | ICD-10-CM

## 2021-11-30 DIAGNOSIS — I10 Essential (primary) hypertension: Secondary | ICD-10-CM

## 2021-11-30 DIAGNOSIS — E782 Mixed hyperlipidemia: Secondary | ICD-10-CM

## 2021-11-30 DIAGNOSIS — R7303 Prediabetes: Secondary | ICD-10-CM

## 2021-11-30 DIAGNOSIS — E2740 Unspecified adrenocortical insufficiency: Secondary | ICD-10-CM

## 2021-11-30 DIAGNOSIS — I4510 Unspecified right bundle-branch block: Secondary | ICD-10-CM

## 2021-11-30 NOTE — Progress Notes (Signed)
ID:  Randall Hudson, DOB September 12, 1940, MRN [id number]  PCP:  Prince Solian, MD  Cardiologist:  Rex Kras, DO, [facility] (established care 11/30/2021)  REASON FOR CONSULT: Dyspnea on exertion  REQUESTING PHYSICIAN:  Prince Solian, MD [address] [place],  [place] [address]  Chief Complaint  Patient presents with   dyspnea on exertion   New Patient (Initial Visit)    HPI  Randall Hudson is a 81 y.o. Caucasian male who presents to the clinic for evaluation of dyspnea at the request of Avva, Ravisankar, MD. His past medical history and cardiovascular risk factors include: Prediabetes, hyperlipidemia, hypertension, asthma, history of pituitary macroadenoma status resection, advanced age.  Patient presents to the office for evaluation of dyspnea on exertion.  The symptoms have been ongoing for the last 3 months at least but more progressive over the last 10 days.  One of the episodes over the last 10 days happened when he was walking uphill and walking just 100 yards he was significantly short of breath.  He had to stop midway.  By the time he got home which was uphill he had to lay down and found himself diaphoretic.  The symptoms resolved within 20 minutes.  Generally he is quite active at baseline.  He enjoys hiking (1-1.5 miles) and gardening.  No prolonged immobilization, no recent surgeries, no prior history of cancer, no prior history of DVT or PE.  He is been working on weight loss and has lost approximately 10 pounds over the course of 1 month.  His colon cancer screening has entailed Cologuard x2 and no significant details on prostate screening.  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  No prior cardiovascular work-up.   ALLERGIES: No Known Allergies  MEDICATION LIST PRIOR TO VISIT: Current Meds  Medication Sig   benazepril (LOTENSIN) 20 MG tablet Take 10 mg by mouth daily.    Cholecalciferol 25 MCG (1000 UT) capsule Take 1 tablet by mouth every morning.   Coenzyme Q10 (CO Q 10 PO) Take 1 tablet by mouth daily at 12 noon.   dorzolamide-timolol (COSOPT) 22.3-6.8 MG/ML ophthalmic solution Place 1 drop into the right eye 2 (two) times daily.    hydrocortisone (CORTEF) 5 MG tablet Take by mouth.   levothyroxine (SYNTHROID) 88 MCG tablet Take 88 mcg by mouth daily.   rosuvastatin (CRESTOR) 5 MG tablet Take 5 mg by mouth at bedtime.   testosterone cypionate (DEPOTESTOSTERONE CYPIONATE) 200 MG/ML injection Inject 200 mg into the muscle every 28 (twenty-eight) days.   [DISCONTINUED] amLODipine (NORVASC) 5 MG tablet Take 5 mg by mouth daily.   [DISCONTINUED] hydrochlorothiazide (HYDRODIURIL) 25 MG tablet Take 12.5 mg by mouth daily.     PAST MEDICAL HISTORY: Past Medical History:  Diagnosis Date   Actinic keratosis    Allergic rhinitis    Bronchitis    Degenerative joint disease    Diabetes mellitus without complication ([place])    Epididymitis    Hyperlipemia    Hypertension     PAST SURGICAL HISTORY: Past Surgical History:  Procedure Laterality Date   BRAIN SURGERY     HERNIA REPAIR     THROAT SURGERY      FAMILY HISTORY:   SOCIAL HISTORY:  The patient  reports that he has never smoked. He has never used smokeless tobacco. He reports current alcohol use. He reports that he does not use drugs.  REVIEW OF SYSTEMS: Review of  Systems  Cardiovascular:  Positive for dyspnea on exertion. Negative for chest pain, claudication, irregular heartbeat, leg swelling, near-syncope, orthopnea, palpitations, paroxysmal nocturnal dyspnea and syncope.  Respiratory:  Negative for shortness of breath.   Hematologic/Lymphatic: Negative for bleeding problem.  Musculoskeletal:  Negative for muscle cramps and myalgias.  Neurological:  Negative for dizziness and light-headedness.    PHYSICAL EXAM:    11/30/2021   12:22 PM 03/12/2021    1:32 PM 02/21/2020    2:21 PM  Vitals  with BMI  Height '5\' 10"'  5' 10.5" 5' 10.75"  Weight 196 lbs 196 lbs 198 lbs  BMI 28.12 27.25 36.64  Systolic 403 474 259  Diastolic 81 76 76  Pulse 60     Orthostatic VS for the past 72 hrs (Last 3 readings):  Orthostatic BP Patient Position BP Location Cuff Size Orthostatic Pulse  11/30/21 1230 (!) 139/91 Standing Left Arm Normal 63  11/30/21 1229 126/80 Sitting Left Arm Normal 63  11/30/21 1227 150/83 Supine Left Arm Normal 53    CONSTITUTIONAL: Age-appropriate, hemodynamically stable, well-developed and well-nourished. No acute distress.  SKIN: Skin is warm and dry. No rash noted. No cyanosis. No pallor. No jaundice HEAD: Normocephalic and atraumatic.  EYES: No scleral icterus.  Subconjunctival hemorrhage left eye (status post procedure).  MOUTH/THROAT: Moist oral membranes.  NECK: No JVD present. No thyromegaly noted. No carotid bruits  CHEST Normal respiratory effort. No intercostal retractions  LUNGS: Clear to auscultation bilaterally.  No stridor. No wheezes. No rales.  CARDIOVASCULAR: Regular rate and rhythm, positive D6-L8, soft systolic murmur, no rubs or gallops appreciated. ABDOMINAL: Soft, nontender, nondistended, positive bowel sounds in all 4 quadrants, no apparent ascites.  EXTREMITIES: No peripheral edema, warm to touch, 2+ bilateral DP and PT pulses HEMATOLOGIC: No significant bruising NEUROLOGIC: Oriented to person, place, and time. Nonfocal. Normal muscle tone.  PSYCHIATRIC: Normal mood and affect. Normal behavior. Cooperative  CARDIAC DATABASE: EKG: 11/30/21 Sinus bradycardia, 53 bpm, right bundle branch block, left anterior fascicular block, without underlying injury pattern.  Echocardiogram: No results found for this or any previous visit from the past 1095 days.    Stress Testing: No results found for this or any previous visit from the past 1095 days.   Heart Catheterization: None  LABORATORY DATA: External Labs: Collected: 11/23/2021: eGFR  >60. BUN 21, creatinine 1. Sodium 140, potassium 4.5, chloride 109, bicarb 22, AST 19, ALT 28, alkaline phosphatase 37. Hemoglobin 15.8 g/dL, hematocrit 44.8%. A1c 5.5 TSH 0.04 (lower limit of normal)  Collected: 08/24/2021 provided by referring physician. BUN 19, creatinine 0.8. eGFR >60 mL/min per 1.73 m. Sodium 141, potassium 4.4, chloride 103, bicarb 25, AST 16, ALT 20, alkaline phosphatase 35. Total cholesterol 217, triglycerides 100d, HDL 60, LDL 137, non-HDL 157. Hemoglobin 14.7 g/dL, hematocrit 43.3%.  Collected 04/09/2021 provided by referring physician TSH 0.09  IMPRESSION:    ICD-10-CM   1. Dyspnea on exertion  R06.09 EKG 12-Lead    PCV ECHOCARDIOGRAM COMPLETE    PCV MYOCARDIAL PERFUSION WO LEXISCAN    CMP14+EGFR    D-dimer, quantitative    B Nat Peptide    2. RBBB  I45.10 PCV MYOCARDIAL PERFUSION WO LEXISCAN    3. Benign hypertension  I10     4. Adrenal insufficiency (HCC)  E27.40     5. Prediabetes  R73.03     6. Mixed hyperlipidemia  E78.2        RECOMMENDATIONS: KASSON LAMERE is a 81 y.o. Caucasian male whose past medical history and  cardiac risk factors include: Prediabetes, hyperlipidemia, hypertension, asthma, history of pituitary macroadenoma status resection, advanced age.  Dyspnea on exertion Significant change in functional capacity over the last 3 weeks. Denies angina factors or heart failure symptoms. EKG: Sinus rhythm with underlying right bundle branch block. Outside labs independently reviewed. Check BNP and D-dimer levels. Echo will be ordered to evaluate for structural heart disease and left ventricular systolic function. Given his symptoms of dyspnea on exertion, risk factors, change in overall functional capacity, would like to proceed with exercise nuclear stress test to evaluate for reversible ischemia. Overall pretest probability for DVT/PE is low but given the sudden change in clinical symptoms would like to screen him by checking a  D-dimer levels.  If abnormal he will need a CT PE protocol to rule out pulmonary embolism. Patient is asked to not overexert himself until the work-up is complete. If he develops any chest pain or has worsening symptoms he is asked to go to the closest ER via EMS for further evaluation and management.  Both patient and wife verbalized understanding  Benign hypertension / Adrenal insufficiency (HCC) Orthostatic vital signs positive. Currently takes both amlodipine and benazepril at the same time. I suspect that he may be having episodes of hypotension/orthostasis.  Therefore have asked him to hold amlodipine for now.  Check blood pressures twice a day and record them and have been reviewed with either myself or PCP. Currently on hydrocortisone for adrenal insufficiency.  Mixed hyperlipidemia Currently on rosuvastatin.   He denies myalgia or other side effects. Currently managed by primary care provider.  Data Reviewed: I have independently reviewed external notes provided by the referring provider as part of this office visit.   I have independently reviewed results of labs, EKG, as part of medical decision making. I have ordered the following tests:  Orders Placed This Encounter  Procedures   CMP14+EGFR   D-dimer, quantitative   B Nat Peptide   PCV MYOCARDIAL PERFUSION WO LEXISCAN    Standing Status:   Future    Standing Expiration Date:   12/01/2022   EKG 12-Lead   PCV ECHOCARDIOGRAM COMPLETE    Standing Status:   Future    Standing Expiration Date:   12/01/2022  I have made medications changes at today's encounter as noted above. History of present illness was obtained by both patient and wife Baker Janus at today's office visit.    FINAL MEDICATION LIST END OF ENCOUNTER: No orders of the defined types were placed in this encounter.   Medications Discontinued During This Encounter  Medication Reason   amoxicillin-clavulanate (AUGMENTIN) 875-125 MG tablet    cefdinir (OMNICEF) 300 MG  capsule    diclofenac sodium (VOLTAREN) 1 % GEL    DUREZOL 0.05 % EMUL    ibuprofen (ADVIL,MOTRIN) 200 MG tablet    Multiple Vitamins-Minerals (PRESERVISION/LUTEIN PO)    PROAIR HFA 108 (90 Base) MCG/ACT inhaler    tamsulosin (FLOMAX) 0.4 MG CAPS capsule    Vitamin D, Cholecalciferol, 1000 UNITS CAPS    hydrochlorothiazide (HYDRODIURIL) 25 MG tablet    amLODipine (NORVASC) 5 MG tablet      Current Outpatient Medications:    benazepril (LOTENSIN) 20 MG tablet, Take 10 mg by mouth daily., Disp: , Rfl:    Cholecalciferol 25 MCG (1000 UT) capsule, Take 1 tablet by mouth every morning., Disp: , Rfl:    Coenzyme Q10 (CO Q 10 PO), Take 1 tablet by mouth daily at 12 noon., Disp: , Rfl:    dorzolamide-timolol (  COSOPT) 22.3-6.8 MG/ML ophthalmic solution, Place 1 drop into the right eye 2 (two) times daily. , Disp: , Rfl:    hydrocortisone (CORTEF) 5 MG tablet, Take by mouth., Disp: , Rfl:    levothyroxine (SYNTHROID) 88 MCG tablet, Take 88 mcg by mouth daily., Disp: , Rfl:    rosuvastatin (CRESTOR) 5 MG tablet, Take 5 mg by mouth at bedtime., Disp: , Rfl:    testosterone cypionate (DEPOTESTOSTERONE CYPIONATE) 200 MG/ML injection, Inject 200 mg into the muscle every 28 (twenty-eight) days., Disp: , Rfl:   Orders Placed This Encounter  Procedures   CMP14+EGFR   D-dimer, quantitative   B Nat Peptide   PCV MYOCARDIAL PERFUSION WO LEXISCAN   EKG 12-Lead   PCV ECHOCARDIOGRAM COMPLETE    There are no Patient Instructions on file for this visit.   --Continue cardiac medications as reconciled in final medication list. --Return in about 4 weeks (around 12/28/2021) for Reevaluation of, Dyspnea, Review test results. or sooner if needed. --Continue follow-up with your primary care physician regarding the management of your other chronic comorbid conditions.  Patient's questions and concerns were addressed to his satisfaction. He voices understanding of the instructions provided during this encounter.    This note was created using a voice recognition software as a result there may be grammatical errors inadvertently enclosed that do not reflect the nature of this encounter. Every attempt is made to correct such errors.  Rex Kras, [place], [facility]  Pager: [phone] Office: [phone]

## 2021-12-01 LAB — CMP14+EGFR
ALT: 23 IU/L (ref 0–44)
AST: 19 IU/L (ref 0–40)
Albumin/Globulin Ratio: 1.8 (ref 1.2–2.2)
Albumin: 4.8 g/dL (ref 3.8–4.8)
Alkaline Phosphatase: 43 IU/L — ABNORMAL LOW (ref 44–121)
BUN/Creatinine Ratio: 22 (ref 10–24)
BUN: 20 mg/dL (ref 8–27)
Bilirubin Total: 0.6 mg/dL (ref 0.0–1.2)
CO2: 21 mmol/L (ref 20–29)
Calcium: 9.9 mg/dL (ref 8.6–10.2)
Chloride: 103 mmol/L (ref 96–106)
Creatinine, Ser: 0.92 mg/dL (ref 0.76–1.27)
Globulin, Total: 2.6 g/dL (ref 1.5–4.5)
Glucose: 84 mg/dL (ref 70–99)
Potassium: 4.6 mmol/L (ref 3.5–5.2)
Sodium: 141 mmol/L (ref 134–144)
Total Protein: 7.4 g/dL (ref 6.0–8.5)
eGFR: 84 mL/min/{1.73_m2} (ref >59)

## 2021-12-01 LAB — D-DIMER, QUANTITATIVE: D-DIMER: 0.23 mg/L FEU (ref 0.00–0.49)

## 2021-12-01 LAB — BRAIN NATRIURETIC PEPTIDE: BNP: 15.2 pg/mL (ref 0.0–100.0)

## 2021-12-11 NOTE — Progress Notes (Signed)
Called pt no answer, could not leave a vm.

## 2021-12-16 NOTE — Progress Notes (Signed)
Called and spoke to patient he voiced understanding

## 2021-12-23 ENCOUNTER — Ambulatory Visit: Payer: Medicare Other

## 2021-12-23 DIAGNOSIS — R0609 Other forms of dyspnea: Secondary | ICD-10-CM

## 2021-12-23 DIAGNOSIS — I4510 Unspecified right bundle-branch block: Secondary | ICD-10-CM

## 2021-12-29 ENCOUNTER — Ambulatory Visit: Payer: Medicare Other

## 2021-12-29 DIAGNOSIS — R0609 Other forms of dyspnea: Secondary | ICD-10-CM

## 2022-01-06 ENCOUNTER — Encounter: Payer: Self-pay | Admitting: Cardiology

## 2022-01-06 ENCOUNTER — Ambulatory Visit: Payer: Medicare Other | Admitting: Cardiology

## 2022-01-06 VITALS — BP 144/80 | HR 67 | Temp 98.1°F | Resp 16 | Ht 70.0 in | Wt 191.8 lb

## 2022-01-06 DIAGNOSIS — E782 Mixed hyperlipidemia: Secondary | ICD-10-CM

## 2022-01-06 DIAGNOSIS — I10 Essential (primary) hypertension: Secondary | ICD-10-CM

## 2022-01-06 DIAGNOSIS — R0609 Other forms of dyspnea: Secondary | ICD-10-CM

## 2022-01-06 DIAGNOSIS — I4510 Unspecified right bundle-branch block: Secondary | ICD-10-CM

## 2022-01-06 DIAGNOSIS — E2740 Unspecified adrenocortical insufficiency: Secondary | ICD-10-CM

## 2022-01-06 NOTE — Progress Notes (Signed)
ID:  Randall Hudson, DOB [birth year], MRN [id number]  PCP:  Prince Solian, MD  Cardiologist:  Rex Kras, DO, [facility] (established care 11/30/2021)  Date: 01/06/22 Last Office Visit: 11/30/2021  Chief Complaint  Patient presents with   Shortness of Breath   Results   Follow-up    5 weeks     HPI  Randall Hudson is a 81 y.o. Caucasian male whose past medical history and cardiovascular risk factors include: Prediabetes, hyperlipidemia, hypertension, asthma, history of pituitary macroadenoma status resection, advanced age.  Patient was referred to the practice for evaluation of dyspnea on exertion.  His symptoms have been ongoing for at least 3 months prior to establishing care.  At the last office visit the shared decision was to proceed with an echo, stress test, and laboratory testing.  His BNP and D-dimers are within normal limits.  Echocardiogram notes preserved LVEF without any significant valvular heart disease.  An exercise nuclear stress test reported to be low risk with frequent PACs/occasional PVCs.  He now presents for 5-week follow-up visit. Now is asymptomatic w/ regards to dyspnea on exertion. He was treated for asthma with steroids, inhalers, and antibiotics.  Generally he is quite active at baseline usually enjoys hiking (1-1.5 miles) and gardening. Will get back to it in sometime.   ALLERGIES: No Known Allergies  MEDICATION LIST PRIOR TO VISIT: Current Meds  Medication Sig   benazepril (LOTENSIN) 20 MG tablet Take 10 mg by mouth daily.   Cholecalciferol 25 MCG (1000 UT) capsule Take 1 tablet by mouth every morning.   Coenzyme Q10 (CO Q 10 PO) Take 1 tablet by mouth daily at 12 noon.   dorzolamide-timolol (COSOPT) 22.3-6.8 MG/ML ophthalmic solution Place 1 drop into the right eye 2 (two) times daily.    hydrocortisone (CORTEF) 5 MG tablet Take by mouth.   levothyroxine (SYNTHROID) 88 MCG tablet Take 88 mcg by mouth daily.   rosuvastatin (CRESTOR) 5 MG tablet Take  5 mg by mouth at bedtime.   testosterone cypionate (DEPOTESTOSTERONE CYPIONATE) 200 MG/ML injection Inject 200 mg into the muscle every 28 (twenty-eight) days.     PAST MEDICAL HISTORY: Past Medical History:  Diagnosis Date   Actinic keratosis    Allergic rhinitis    Bronchitis    Degenerative joint disease    Diabetes mellitus without complication ([place])    Epididymitis    Hyperlipemia    Hypertension     PAST SURGICAL HISTORY: Past Surgical History:  Procedure Laterality Date   BRAIN SURGERY     HERNIA REPAIR     THROAT SURGERY      FAMILY HISTORY:   SOCIAL HISTORY:  The patient  reports that he has never smoked. He has never used smokeless tobacco. He reports current alcohol use. He reports that he does not use drugs.  REVIEW OF SYSTEMS: Review of Systems  Cardiovascular:  Positive for dyspnea on exertion. Negative for chest pain, claudication, irregular heartbeat, leg swelling, near-syncope, orthopnea, palpitations, paroxysmal nocturnal dyspnea and syncope.  Respiratory:  Negative for shortness of breath.   Hematologic/Lymphatic: Negative for bleeding problem.  Musculoskeletal:  Negative for muscle cramps and myalgias.  Neurological:  Negative for dizziness and light-headedness.    PHYSICAL EXAM:    01/06/2022   10:48 AM 01/06/2022   10:41 AM 11/30/2021   12:22 PM  Vitals with BMI  Height  '5\' 10"'  '5\' 10"'   Weight  191 lbs 13 oz 196 lbs  BMI  43.15 40.08  Systolic 676  115 726  Diastolic 80 76 81  Pulse  67 60   CONSTITUTIONAL: Age-appropriate, hemodynamically stable, well-developed and well-nourished. No acute distress.  SKIN: Skin is warm and dry. No rash noted. No cyanosis. No pallor. No jaundice HEAD: Normocephalic and atraumatic.  EYES: No scleral icterus.  Subconjunctival hemorrhage left eye (status post procedure).  MOUTH/THROAT: Moist oral membranes.  NECK: No JVD present. No thyromegaly noted. No carotid bruits  CHEST Normal respiratory effort. No  intercostal retractions  LUNGS: Clear to auscultation bilaterally.  No stridor. No wheezes. No rales.  CARDIOVASCULAR: Regular rate and rhythm, positive O0-B5, soft systolic murmur, no rubs or gallops appreciated. ABDOMINAL: Soft, nontender, nondistended, positive bowel sounds in all 4 quadrants, no apparent ascites.  EXTREMITIES: No peripheral edema, warm to touch, 2+ bilateral DP and PT pulses HEMATOLOGIC: No significant bruising NEUROLOGIC: Oriented to person, place, and time. Nonfocal. Normal muscle tone.  PSYCHIATRIC: Normal mood and affect. Normal behavior. Cooperative  CARDIAC DATABASE: EKG: 11/30/21 Sinus bradycardia, 53 bpm, right bundle branch block, left anterior fascicular block, without underlying injury pattern.  Echocardiogram: 12/29/2021:  Study Quality: Technically difficult study.  Normal LV systolic function with visual EF 60-65%. Left ventricle cavity is normal in size. Normal global wall motion. Mild left ventricular  hypertrophy. Unable to evaluate diastolic function due to suboptimal image acquisition.  Mild (Grade I) mitral regurgitation.  No prior study for comparison.   Stress Testing: Exercise Tetrofosmin stress test 12/23/2021: Exercise nuclear stress test was performed using Bruce protocol. Patient reached 7 METS, and 86% of age predicted maximum heart rate. Exercise capacity was low. No chest pain reported. Heart rate and hemodynamic response were normal.  Stress EKG showed sinus tachycardia, RBBB, LAFB, periods of paroxysmal atrial tachycardia, occasional PVCs, frequent PACs, T wave inversion likely related to RBBB. Normal myocardial perfusion. Stress LVEF 57%. Low risk study.  Heart Catheterization: None  LABORATORY DATA: External Labs: Collected: 11/23/2021: eGFR >60. BUN 21, creatinine 1. Sodium 140, potassium 4.5, chloride 109, bicarb 22, AST 19, ALT 28, alkaline phosphatase 37. Hemoglobin 15.8 g/dL, hematocrit 44.8%. A1c 5.5 TSH 0.04 (lower  limit of normal)  Collected: 08/24/2021 provided by referring physician. BUN 19, creatinine 0.8. eGFR >60 mL/min per 1.73 m. Sodium 141, potassium 4.4, chloride 103, bicarb 25, AST 16, ALT 20, alkaline phosphatase 35. Total cholesterol 217, triglycerides 100d, HDL 60, LDL 137, non-HDL 157. Hemoglobin 14.7 g/dL, hematocrit 43.3%.  Collected 04/09/2021 provided by referring physician TSH 0.09  IMPRESSION:    ICD-10-CM   1. Dyspnea on exertion  R06.09     2. Benign hypertension  I10     3. Adrenal insufficiency (HCC)  E27.40     4. Mixed hyperlipidemia  E78.2     5. RBBB  I45.10         RECOMMENDATIONS: KASH DAVIE is a 81 y.o. Caucasian male whose past medical history and cardiac risk factors include: Prediabetes, hyperlipidemia, hypertension, asthma, history of pituitary macroadenoma status resection, advanced age.  Dyspnea on exertion Resolved EKG: Sinus rhythm with right bundle branch block. Outside labs were independently reviewed during initial consultation noted above for further reference. Labs from 11/30/2021 independently reviewed which noted normal BNP and D-dimer levels. Echocardiogram results reviewed with the patient and wife at today's visit and noted above for further reference. Stress test reported to be low risk without any convincing evidence of reversible myocardial ischemia.  He does have inferior wall perfusion defect likely due to artifact more prominent on the rest compared to stress  images. Since last office visit was treated for asthma exacerbation with inhalers, steroids, and antibiotics.  Thereafter his symptoms have resolved and now he is back to baseline.  Benign hypertension / Adrenal insufficiency ([place]) Office blood pressures are within acceptable limits. Home blood pressures are better controlled with SBP around 130 mmHg. At last office visit I had discontinued amlodipine given his orthostatic vital signs being positive.   He is already on  steroids given his adrenal insufficiency.  Mixed hyperlipidemia Currently on rosuvastatin.   He denies myalgia or other side effects. Currently managed by primary care provider.  Plan of care discussed with both the patient and his wife Baker Janus at today's office visit. He is doing well from a cardiovascular standpoint for now and his overall ischemic work-up was unremarkable. We will follow-up with him on an annual basis or sooner if needed.   FINAL MEDICATION LIST END OF ENCOUNTER: No orders of the defined types were placed in this encounter.   There are no discontinued medications.    Current Outpatient Medications:    benazepril (LOTENSIN) 20 MG tablet, Take 10 mg by mouth daily., Disp: , Rfl:    Cholecalciferol 25 MCG (1000 UT) capsule, Take 1 tablet by mouth every morning., Disp: , Rfl:    Coenzyme Q10 (CO Q 10 PO), Take 1 tablet by mouth daily at 12 noon., Disp: , Rfl:    dorzolamide-timolol (COSOPT) 22.3-6.8 MG/ML ophthalmic solution, Place 1 drop into the right eye 2 (two) times daily. , Disp: , Rfl:    hydrocortisone (CORTEF) 5 MG tablet, Take by mouth., Disp: , Rfl:    levothyroxine (SYNTHROID) 88 MCG tablet, Take 88 mcg by mouth daily., Disp: , Rfl:    rosuvastatin (CRESTOR) 5 MG tablet, Take 5 mg by mouth at bedtime., Disp: , Rfl:    testosterone cypionate (DEPOTESTOSTERONE CYPIONATE) 200 MG/ML injection, Inject 200 mg into the muscle every 28 (twenty-eight) days., Disp: , Rfl:   No orders of the defined types were placed in this encounter.   There are no Patient Instructions on file for this visit.   --Continue cardiac medications as reconciled in final medication list. --Return in about 1 year (around 01/07/2023) for Annual follow up visit, Dyspnea. or sooner if needed. --Continue follow-up with your primary care physician regarding the management of your other chronic comorbid conditions.  Patient's questions and concerns were addressed to his satisfaction. He voices  understanding of the instructions provided during this encounter.   This note was created using a voice recognition software as a result there may be grammatical errors inadvertently enclosed that do not reflect the nature of this encounter. Every attempt is made to correct such errors.  Rex Kras, [place], [facility]  Pager: [phone] Office: [phone]

## 2023-01-05 ENCOUNTER — Other Ambulatory Visit: Payer: Self-pay

## 2023-01-05 ENCOUNTER — Ambulatory Visit: Payer: Medicare Other | Admitting: Sports Medicine

## 2023-01-05 VITALS — BP 126/84 | Ht 70.75 in | Wt 193.0 lb

## 2023-01-05 DIAGNOSIS — M19072 Primary osteoarthritis, left ankle and foot: Secondary | ICD-10-CM | POA: Diagnosis not present

## 2023-01-05 DIAGNOSIS — M79672 Pain in left foot: Secondary | ICD-10-CM

## 2023-01-05 DIAGNOSIS — R269 Unspecified abnormalities of gait and mobility: Secondary | ICD-10-CM

## 2023-01-05 NOTE — Assessment & Plan Note (Signed)
Patient demonstrates a Trendelenburg shift to the right and has a shorter leg We will need to use a lift in his right insole to lessen this gait shift

## 2023-01-05 NOTE — Progress Notes (Signed)
Chief complaint left foot pain x 6 days  Patient has been in orthotics in the past because of foot breakdown and pronation bilaterally.  He also has a right leg that is 2 cm shorter  For the past 4 months he has not been wearing his orthotics because he has been wearing more sketchers.  These are easy to get on and off  He was doing some yard work including pushing a small talar and developed significant pain in his left foot.  This was located over the dorsum of the foot continues to be painful.  He notes that the foot seems a little swollen to him.  Physical exam Pleasant older male in no acute distress BP 126/84   Ht 5' 10.75" (1.797 m)   Wt 193 lb (87.5 kg)   BMI 27.11 kg/m   Left foot shows some tenderness directly over the tarsometatarsal joints particularly at joints 2 and 3 There is mild redness and some localized swelling at TMT joint to on the dorsum On the right foot there is some metatarsal bossing over the TMT joints No tenderness swelling or pain noted  Standing shows that he pronates at rest with navicular drop He has loss of the longitudinal arch  Walking gait shows a Trendelenburg toward his right side which is the shorter side by about 2 cm  Ultrasound of the left foot  There is a small effusion and some spurring noted at the talonavicular joint Over TMT joints 2 and 3 there is significant spurring and hypoechoic change Over TMT joint 4 there is fragmentation and a small effusion TMT joint 1 looks normal  On the right foot TMT joints 2 and 3 have some slight spurring but no significant hypoechoic change noted  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 Darrick Penna, MD

## 2023-01-05 NOTE — Assessment & Plan Note (Signed)
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  I believe this was worsening by the fact that he has not been using his custom orthotics.  Today I made him a sports insole with scaphoid pad I placed an arch strap on his left midfoot On the right I put a scaphoid pad and a heel lift to partially correct the leg length inequality  He did feel more comfortable walking once these changes were made  He is to start on topical Voltaren 3-4 times a day Use ice at night  I would like to encourage him to return to using his custom orthotics

## 2023-02-07 ENCOUNTER — Ambulatory Visit: Payer: Self-pay | Admitting: Cardiology

## 2023-03-10 ENCOUNTER — Encounter (HOSPITAL_COMMUNITY): Payer: Self-pay | Admitting: Emergency Medicine

## 2023-03-10 ENCOUNTER — Other Ambulatory Visit: Payer: Self-pay

## 2023-03-10 ENCOUNTER — Inpatient Hospital Stay (HOSPITAL_COMMUNITY)
Admission: EM | Admit: 2023-03-10 | Discharge: 2023-03-12 | DRG: 243 | Disposition: A | Discharge status: Home / Self Care | Payer: Medicare Other | Attending: Cardiology | Admitting: Cardiology

## 2023-03-10 ENCOUNTER — Emergency Department (HOSPITAL_COMMUNITY): Payer: Medicare Other

## 2023-03-10 DIAGNOSIS — R7303 Prediabetes: Secondary | ICD-10-CM | POA: Diagnosis present

## 2023-03-10 DIAGNOSIS — R001 Bradycardia, unspecified: Principal | ICD-10-CM | POA: Diagnosis present

## 2023-03-10 DIAGNOSIS — E2740 Unspecified adrenocortical insufficiency: Secondary | ICD-10-CM | POA: Diagnosis present

## 2023-03-10 DIAGNOSIS — I452 Bifascicular block: Secondary | ICD-10-CM | POA: Diagnosis present

## 2023-03-10 DIAGNOSIS — I4719 Other supraventricular tachycardia: Secondary | ICD-10-CM | POA: Diagnosis present

## 2023-03-10 DIAGNOSIS — J45909 Unspecified asthma, uncomplicated: Secondary | ICD-10-CM | POA: Diagnosis present

## 2023-03-10 DIAGNOSIS — E039 Hypothyroidism, unspecified: Secondary | ICD-10-CM | POA: Diagnosis present

## 2023-03-10 DIAGNOSIS — I493 Ventricular premature depolarization: Secondary | ICD-10-CM | POA: Diagnosis present

## 2023-03-10 DIAGNOSIS — R5383 Other fatigue: Secondary | ICD-10-CM | POA: Diagnosis present

## 2023-03-10 DIAGNOSIS — E785 Hyperlipidemia, unspecified: Secondary | ICD-10-CM | POA: Diagnosis present

## 2023-03-10 DIAGNOSIS — I7121 Aneurysm of the ascending aorta, without rupture: Secondary | ICD-10-CM | POA: Diagnosis present

## 2023-03-10 DIAGNOSIS — I10 Essential (primary) hypertension: Secondary | ICD-10-CM | POA: Diagnosis present

## 2023-03-10 DIAGNOSIS — I441 Atrioventricular block, second degree: Secondary | ICD-10-CM | POA: Diagnosis present

## 2023-03-10 DIAGNOSIS — Z79899 Other long term (current) drug therapy: Secondary | ICD-10-CM | POA: Diagnosis not present

## 2023-03-10 DIAGNOSIS — I340 Nonrheumatic mitral (valve) insufficiency: Secondary | ICD-10-CM | POA: Diagnosis present

## 2023-03-10 DIAGNOSIS — I442 Atrioventricular block, complete: Principal | ICD-10-CM | POA: Diagnosis present

## 2023-03-10 DIAGNOSIS — R748 Abnormal levels of other serum enzymes: Secondary | ICD-10-CM | POA: Diagnosis present

## 2023-03-10 DIAGNOSIS — Z79890 Hormone replacement therapy (postmenopausal): Secondary | ICD-10-CM | POA: Diagnosis not present

## 2023-03-10 DIAGNOSIS — I459 Conduction disorder, unspecified: Secondary | ICD-10-CM | POA: Diagnosis present

## 2023-03-10 LAB — COMPREHENSIVE METABOLIC PANEL
ALT: 105 U/L — ABNORMAL HIGH (ref 0–44)
AST: 78 U/L — ABNORMAL HIGH (ref 15–41)
Albumin: 3.6 g/dL (ref 3.5–5.0)
Alkaline Phosphatase: 46 U/L (ref 38–126)
Anion gap: 9 (ref 5–15)
BUN: 22 mg/dL (ref 8–23)
CO2: 21 mmol/L — ABNORMAL LOW (ref 22–32)
Calcium: 9.2 mg/dL (ref 8.9–10.3)
Chloride: 110 mmol/L (ref 98–111)
Creatinine, Ser: 1.15 mg/dL (ref 0.61–1.24)
GFR, Estimated: >60 mL/min (ref >60)
Glucose, Bld: 106 mg/dL — ABNORMAL HIGH (ref 70–99)
Potassium: 4.2 mmol/L (ref 3.5–5.1)
Sodium: 140 mmol/L (ref 135–145)
Total Bilirubin: 0.8 mg/dL (ref <1.2)
Total Protein: 6.4 g/dL — ABNORMAL LOW (ref 6.5–8.1)

## 2023-03-10 LAB — CBC WITH DIFFERENTIAL/PLATELET
Abs Immature Granulocytes: 0.03 10*3/uL (ref 0.00–0.07)
Basophils Absolute: 0.1 10*3/uL (ref 0.0–0.1)
Basophils Relative: 1 %
Eosinophils Absolute: 0.3 10*3/uL (ref 0.0–0.5)
Eosinophils Relative: 3 %
HCT: 42.6 % (ref 39.0–52.0)
Hemoglobin: 14.2 g/dL (ref 13.0–17.0)
Immature Granulocytes: 0 %
Lymphocytes Relative: 28 %
Lymphs Abs: 2.3 10*3/uL (ref 0.7–4.0)
MCH: 30.8 pg (ref 26.0–34.0)
MCHC: 33.3 g/dL (ref 30.0–36.0)
MCV: 92.4 fL (ref 80.0–100.0)
Monocytes Absolute: 0.7 10*3/uL (ref 0.1–1.0)
Monocytes Relative: 8 %
Neutro Abs: 5.1 10*3/uL (ref 1.7–7.7)
Neutrophils Relative %: 60 %
Platelets: 134 10*3/uL — ABNORMAL LOW (ref 150–400)
RBC: 4.61 MIL/uL (ref 4.22–5.81)
RDW: 14.5 % (ref 11.5–15.5)
WBC: 8.5 10*3/uL (ref 4.0–10.5)
nRBC: 0 % (ref 0.0–0.2)

## 2023-03-10 LAB — TSH: TSH: 0.024 u[IU]/mL — ABNORMAL LOW (ref 0.350–4.500)

## 2023-03-10 LAB — T4, FREE: Free T4: 0.96 ng/dL (ref 0.61–1.12)

## 2023-03-10 LAB — TROPONIN I (HIGH SENSITIVITY): Troponin I (High Sensitivity): 17 ng/L (ref <18)

## 2023-03-10 LAB — APTT: aPTT: 31 s (ref 24–36)

## 2023-03-10 LAB — PROTIME-INR
INR: 1.2 (ref 0.8–1.2)
Prothrombin Time: 15.3 s — ABNORMAL HIGH (ref 11.4–15.2)

## 2023-03-10 LAB — MRSA NEXT GEN BY PCR, NASAL: MRSA by PCR Next Gen: NOT DETECTED

## 2023-03-10 LAB — GLUCOSE, CAPILLARY: Glucose-Capillary: 103 mg/dL — ABNORMAL HIGH (ref 70–99)

## 2023-03-10 LAB — MAGNESIUM: Magnesium: 2 mg/dL (ref 1.7–2.4)

## 2023-03-10 MED ORDER — HEPARIN SODIUM (PORCINE) 5000 UNIT/ML IJ SOLN
5000.0000 [IU] | Freq: Three times a day (TID) | INTRAMUSCULAR | Status: DC
  Administered 2023-03-10 – 2023-03-11 (×2): 5000 [IU] via SUBCUTANEOUS
  Filled 2023-03-10 (×2): qty 1

## 2023-03-10 MED ORDER — HYDROCORTISONE 5 MG PO TABS
5.0000 mg | ORAL_TABLET | Freq: Every evening | ORAL | Status: DC
  Administered 2023-03-11: 5 mg via ORAL
  Filled 2023-03-10 (×2): qty 1

## 2023-03-10 MED ORDER — ACETAMINOPHEN 325 MG PO TABS
650.0000 mg | ORAL_TABLET | ORAL | Status: DC | PRN
Start: 2023-03-10 — End: 2023-03-12
  Administered 2023-03-11: 650 mg via ORAL
  Filled 2023-03-10: qty 2

## 2023-03-10 MED ORDER — LEVOTHYROXINE SODIUM 88 MCG PO TABS
88.0000 ug | ORAL_TABLET | Freq: Every day | ORAL | Status: DC
  Administered 2023-03-11 – 2023-03-12 (×2): 88 ug via ORAL
  Filled 2023-03-10 (×2): qty 1

## 2023-03-10 MED ORDER — HYDROCORTISONE 5 MG PO TABS
5.0000 mg | ORAL_TABLET | Freq: Once | ORAL | Status: AC
  Administered 2023-03-10: 5 mg via ORAL
  Filled 2023-03-10: qty 1

## 2023-03-10 MED ORDER — CHLORHEXIDINE GLUCONATE CLOTH 2 % EX PADS
6.0000 | MEDICATED_PAD | Freq: Every day | CUTANEOUS | Status: DC
Start: 2023-03-10 — End: 2023-03-12
  Administered 2023-03-10 – 2023-03-11 (×2): 6 via TOPICAL

## 2023-03-10 MED ORDER — ORAL CARE MOUTH RINSE: 15.0000 mL | OROMUCOSAL | Status: DC | PRN

## 2023-03-10 MED ORDER — HYDROCORTISONE 10 MG PO TABS
10.0000 mg | ORAL_TABLET | Freq: Every morning | ORAL | Status: DC
  Administered 2023-03-11 – 2023-03-12 (×2): 10 mg via ORAL
  Filled 2023-03-10 (×2): qty 1

## 2023-03-10 MED ORDER — ONDANSETRON HCL 4 MG/2ML IJ SOLN: 4.0000 mg | Freq: Four times a day (QID) | INTRAMUSCULAR | Status: DC | PRN

## 2023-03-10 MED ORDER — ROSUVASTATIN CALCIUM 5 MG PO TABS
5.0000 mg | ORAL_TABLET | Freq: Every day | ORAL | Status: DC
  Administered 2023-03-10 – 2023-03-11 (×2): 5 mg via ORAL
  Filled 2023-03-10 (×2): qty 1

## 2023-03-10 NOTE — ED Provider Notes (Signed)
[facility] Note   CSN: [id number] Arrival date & time: 03/10/23  1522     History  Chief Complaint  Patient presents with   Bradycardia    [facility] is a 82 y.o. male.  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  Patient reports that on Monday or Tuesday is when he really noticed just being completely out of energy.  Anytime he would try to do something he just felt like he did not have the strength to keep going.  Going up stairs made him very tired and a little bit winded.  He initially thought he may be coming down with something like a cold and because he takes hydrocortisone he increased his dose yesterday thinking that maybe he was getting sick.  However he has not had cough, fever, chest pain, abdominal pain, nausea, vomiting or diarrhea.  He has been eating and drinking regularly.  He has not started any new medications and denies taking any oral beta-blockers.  He does have an eyedrop with timolol on it but he has been on this for years and the dose has not changed.  His doctor's office patient was noted to have a heart rate in the 30s.  He denies any near-syncope or syncopal events.  The history is provided by the patient and medical records.       Home Medications Prior to Admission medications   Medication Sig Start Date End Date Taking? Authorizing Provider  benazepril (LOTENSIN) 20 MG tablet Take 10 mg by mouth daily.    [provider]  Cholecalciferol 25 MCG (1000 UT) capsule Take 1 tablet by mouth every morning.    [provider]  Coenzyme Q10 (CO Q 10 PO) Take 1 tablet by mouth daily at 12 noon.    [provider]  dorzolamide-timolol (COSOPT) 22.3-6.8 MG/ML ophthalmic  solution Place 1 drop into the right eye 2 (two) times daily.  09/27/14   [provider]  hydrocortisone (CORTEF) 5 MG tablet Take by mouth. 11/18/21   [provider]  levothyroxine (SYNTHROID) 88 MCG tablet Take 88 mcg by mouth daily. 11/26/21   [provider]  rosuvastatin (CRESTOR) 5 MG tablet Take 5 mg by mouth at bedtime.    [provider]  testosterone cypionate (DEPOTESTOSTERONE CYPIONATE) 200 MG/ML injection Inject 200 mg into the muscle every 28 (twenty-eight) days. 11/20/21   [provider]      Allergies    Patient has no known allergies.    Review of Systems   Review of Systems  Physical Exam Updated Vital Signs BP (!) 162/71   Pulse (!) 33   Temp (!) 97.5 F (36.4 C)   Resp 12   SpO2 100%  Physical Exam Vitals and nursing note reviewed.  Constitutional:      General: He is not in acute distress.    Appearance: He is well-developed.  HENT:     Head: Normocephalic and atraumatic.  Eyes:     Conjunctiva/sclera: Conjunctivae normal.     Pupils: Pupils are equal, round, and reactive to light.  Cardiovascular:     Rate and Rhythm: Regular rhythm. Bradycardia present.     Heart sounds: No murmur heard. Pulmonary:     Effort: Pulmonary effort is normal. No  respiratory distress.     Breath sounds: Normal breath sounds. No wheezing or rales.  Abdominal:     General: There is no distension.     Palpations: Abdomen is soft.     Tenderness: There is no abdominal tenderness. There is no guarding or rebound.  Musculoskeletal:        General: No tenderness. Normal range of motion.     Cervical back: Normal range of motion and neck supple.     Right lower leg: No edema.     Left lower leg: No edema.  Skin:    General: Skin is warm and dry.     Findings: No erythema or rash.  Neurological:     Mental Status: He is alert and oriented to person, place, and time. Mental status is at baseline.  Psychiatric:        Behavior:  Behavior normal.     ED Results / Procedures / Treatments   Labs (all labs ordered are listed, but only abnormal results are displayed) Labs Reviewed  CBC WITH DIFFERENTIAL/PLATELET - Abnormal; Notable for the following components:      Result Value   Platelets 134 (*)    All other components within normal limits  COMPREHENSIVE METABOLIC PANEL - Abnormal; Notable for the following components:   CO2 21 (*)    Glucose, Bld 106 (*)    Total Protein 6.4 (*)    AST 78 (*)    ALT 105 (*)    All other components within normal limits  TSH - Abnormal; Notable for the following components:   TSH 0.024 (*)    All other components within normal limits  PROTIME-INR - Abnormal; Notable for the following components:   Prothrombin Time 15.3 (*)    All other components within normal limits  MAGNESIUM  APTT  T4, FREE  TROPONIN I (HIGH SENSITIVITY)    EKG EKG Interpretation Date/Time:  Thursday March 10 2023 15:50:44 EST Ventricular Rate:  38 PR Interval:  196 QRS Duration:  160 QT Interval:  602 QTC Calculation: 478 R Axis:   -63  Text Interpretation: new AV Block Sinus rhythm with 2nd degree A-V block with 2:1 A-V conduction Right bundle branch block Left anterior fascicular block Bifascicular block Left ventricular hypertrophy with repolarization abnormality ( R in aVL , Romhilt-Estes ) Cannot rule out Septal infarct , age undetermined Abnormal ECG When compared with ECG of 06-Oct-2017 10:26, PREVIOUS ECG IS PRESENT Confirmed by Gwyneth Sprout ([id number]) on 03/10/2023 3:54:56 PM  Radiology DG Chest Port 1 View  Result Date: 03/10/2023 CLINICAL DATA:  Fatigue EXAM: PORTABLE CHEST 1 VIEW COMPARISON:  10/01/2014 FINDINGS: Mild cardiomegaly. No acute airspace disease or effusion. No pneumothorax. IMPRESSION: No active disease. Mild cardiomegaly. Electronically Signed   By: Jasmine Pang M.D.   On: 03/10/2023 17:38    Procedures Procedures    Medications Ordered in ED Medications -  No data to display  ED Course/ Medical Decision Making/ A&P                                 Medical Decision Making Amount and/or Complexity of Data Reviewed Labs: ordered. Decision-making details documented in ED Course. Radiology: ordered and independent interpretation performed. Decision-making details documented in ED Course. ECG/medicine tests: ordered and independent interpretation performed. Decision-making details documented in ED Course.  Risk Decision regarding hospitalization.   Pt with multiple medical problems and comorbidities and presenting today  with a complaint that caries a high risk for morbidity and mortality.  Patient presenting today with complaints of exhaustion and found to have a new heart block.  Patient's heart rate is between 30 and 40 consistently while on continuous cardiac monitoring which I independently interpreted.  Blood pressure is elevated here.  He is not on beta-blockers other than an eyedrop but he has been on that for years and has not changed the doses.  He has no known kidney disease.  He does have a history of pituitary tumor and has had that removed and takes hydrocortisone and levothyroxine will ensure no evidence of hypothyroidism or electrolyte abnormalities.  Patient currently is stable and in no acute distress.  He is mentating normally.  If labs are normal patient will need cardiology consult and pacemaker.  7:16 PM I independently interpreted patient's labs and CBC normal except for a platelet count of 134, CMP with normal sodium and potassium and creatinine.  Liver function with an AST of 78 and ALT of 105, normal magnesium and troponin.  Coags are within normal limits.  I have independently visualized and interpreted pt's images today. CXR wnl.  Will discuss with cardiology.  Pt remains HD stable.  TSH is low but may be related to surgery and will check T4 and T3.  Spoke with Dr. Dione Housekeeper who will see them and admit.         Final  Clinical Impression(s) / ED Diagnoses Final diagnoses:  Symptomatic bradycardia  Complete heart block [facility])    Rx / DC Orders ED Discharge Orders     None         Gwyneth Sprout, MD 03/10/23 1916

## 2023-03-10 NOTE — ED Triage Notes (Signed)
Pt sent from PCP after going there to be seen for "exhaustion"  Pt's heart rate was found to be in the 30s.  Pt is asymptomatic other than lethargy.  No CP.  Felt he maybe had a cold.

## 2023-03-10 NOTE — H&P (Signed)
Cardiology Admission History and Physical   Patient ID: ALEJOS ARLINE MRN: [id number]; DOB: Apr 13, 1940   Admission date: 03/10/2023  PCP:  Chilton Greathouse, MD   [facility] Providers Cardiologist:  None        Chief Complaint:  Fatigue   History of Present Illness:   Mr. Sulewski History of Present Illness The patient, an 82 year old individual with a history of prediabetes, hyperlipidemia, hypertension, asthma, and a resected pituitary macroadenoma, presented with exhaustion and increased fatigue since Tuesday. He was found to have a heart rate in the thirties during a visit to his primary care doctor's office, leading to his referral to the emergency department for further evaluation. The patient is currently on hydrocortisone 10mg  AM and 5 mg PM tablets daily and thyroid supplementation due to adrenal insufficiency. He reported taking an extra doses of hydrocortisone, suspecting the onset of an adrenal crisis, before becoming aware of his low heart rate.  The patient has been using timolol eye drops for several years and is not on any other AV nodal blocking agents. His medication regimen also includes Crestor 5 mg at bedtime, testosterone supplementation, and benazepril 10 mg daily. An echocardiogram performed on 12/29/21 showed an EF of 60-65% with mild left ventricular hypertrophy and mild grade one mitral regurgitation. Nuclear stress testing performed on 12/23/21 showed low exercise capacity, right bundle branch block at baseline, periods of atrial tachycardia, frequent PACs and PVCs, and normal myocardial perfusion. The patient has never had a heart catheterization.  The patient's TSH has consistently been low (0.04) following pituitary removal, and he has been taking thyroid supplementation. Recent labs showed slightly elevated liver enzymes (AST 78, ALT 105), which may be compatible with hepatic congestion in the setting of significant bradycardia and reduced cardiac  output. The patient's EKG demonstrated what appears to be 2:1 AV block with right bundle branch block and an underlying heart rate of 33 beats per minute. A prior EKG from 2023 showed sinus bradycardia with a rate of 53 beats per minute, right bundle branch block, left anterior fascicular block, and a PR interval of 212 milliseconds.  The patient reported feeling slightly sluggish and fatigued, similar to having a cold, but denied any fainting episodes, vomiting, diarrhea, or significant headaches. He also reported a slight headache but did not consider it significant. The patient denied any chest pain, stating it was "slight, but nothing to speak of." He also reported taking an extra dose of hydrocortisone for two days due to his increased fatigue. Despite the increased dosage, the patient did not report feeling significantly better.   Past Medical History:  Diagnosis Date   Actinic keratosis    Allergic rhinitis    Bronchitis    Degenerative joint disease    Diabetes mellitus without complication (HCC)    Epididymitis    Hyperlipemia    Hypertension     Past Surgical History:  Procedure Laterality Date   BRAIN SURGERY     HERNIA REPAIR     THROAT SURGERY       Medications Prior to Admission: Prior to Admission medications   Medication Sig Start Date End Date Taking? Authorizing Provider  albuterol (VENTOLIN HFA) 108 (90 Base) MCG/ACT inhaler Inhale 1 puff into the lungs every 6 (six) hours as needed for shortness of breath. 08/18/22 08/18/23 Yes [provider]  benazepril (LOTENSIN) 20 MG tablet Take 10 mg by mouth daily.   Yes [provider]  Cholecalciferol 25 MCG (1000 UT) capsule Take  1 tablet by mouth every morning.   Yes [provider]  Coenzyme Q10 (CO Q 10 PO) Take 1 tablet by mouth daily at 12 noon.   Yes [provider]  dorzolamide-timolol (COSOPT) 22.3-6.8 MG/ML ophthalmic solution Place 1 drop into both eyes 2 (two) times daily.  09/27/14  Yes [provider]  hydrocortisone (CORTEF) 5 MG tablet Take 5-10 mg by mouth See admin instructions. Take 2 tablets by mouth in the morning and then take 1 tablet by mouth at night per patient 11/18/21  Yes [provider]  levothyroxine (SYNTHROID) 88 MCG tablet Take 88 mcg by mouth daily. 11/26/21  Yes [provider]  rosuvastatin (CRESTOR) 5 MG tablet Take 5 mg by mouth at bedtime.   Yes [provider]  tobramycin (TOBREX) 0.3 % ophthalmic solution Place 1 drop into both eyes See admin instructions. One drop in both eyes every 7 weeks per patient 01/18/23  Yes [provider]  testosterone cypionate (DEPOTESTOSTERONE CYPIONATE) 200 MG/ML injection Inject 200 mg into the muscle every 28 (twenty-eight) days. 11/20/21   [provider]     Allergies:   No Known Allergies  Social History:   Social History   Socioeconomic History   Marital status: Married    Spouse name: Not on file   Number of children: Not on file   Years of education: Not on file   Highest education level: Not on file  Occupational History   Not on file  Tobacco Use   Smoking status: Never   Smokeless tobacco: Never  Vaping Use   Vaping status: Never Used  Substance and Sexual Activity   Alcohol use: Yes    Comment: occ   Drug use: No   Sexual activity: Not on file  Other Topics Concern   Not on file  Social History Narrative   Not on file   Social Determinants of Health   Financial Resource Strain: Not on file  Food Insecurity: Not on file  Transportation Needs: Not on file  Physical Activity: Not on file  Stress: Not on file  Social Connections: Not on file  Intimate Partner Violence: Not on file      ROS:  Please see the history of present illness.  No fevers, chills, cough, diarrhea, syncope, bleeding. Mild HA noted. All other ROS reviewed and negative.     Physical Exam/Data:   Vitals:   03/10/23 1745 03/10/23 1845 03/10/23  1930 03/10/23 1959  BP: (!) 156/75 (!) 162/71 (!) 169/69   Pulse: (!) 34 (!) 33 (!) 38   Resp: 14 12 15    Temp:    97.8 F (36.6 C)  SpO2: 99% 100% 100%    No intake or output data in the 24 hours ending 03/10/23 2043    01/05/2023   11:25 AM 01/06/2022   10:41 AM 11/30/2021   12:22 PM  Last 3 Weights  Weight (lbs) 193 lb 191 lb 12.8 oz 196 lb  Weight (kg) 87.544 kg 87 kg 88.905 kg     There is no height or weight on file to calculate BMI.  General:  Well nourished, well developed, in no acute distress HEENT: normal Neck: no JVD Vascular: No carotid bruits; Distal pulses 2+ bilaterally   Cardiac:  normal S1, S2; brady reg; no murmur  Lungs:  clear to auscultation bilaterally, no wheezing, rhonchi or rales  Abd: soft, nontender, no hepatomegaly  Ext: no edema Musculoskeletal:  No deformities, BUE and BLE strength normal  and equal Skin: warm and dry  Neuro:  CNs 2-12 intact, no focal abnormalities noted Psych:  Normal affect     Relevant CV Studies: DIAGNOSTIC Echocardiogram: EF 60-65%, mild left ventricular hypertrophy, mild grade 1 mitral regurgitation (12/29/2021) Nuclear stress test: Bruce protocol, 7 METs, low exercise capacity, right bundle branch block at baseline, atrial tachycardia, frequent PACs and PVCs, normal myocardial perfusion, EF 57% (12/23/2021) EKG: 2:1 AV block, right bundle branch block, heart rate 33 bpm, QRS duration 136 ms, T wave inversions in inferior and lateral precordial leads (03/10/2023)  Laboratory Data:  High Sensitivity Troponin:   Recent Labs  Lab 03/10/23 1620  TROPONINIHS 17      Chemistry Recent Labs  Lab 03/10/23 1620  NA 140  K 4.2  CL 110  CO2 21*  GLUCOSE 106*  BUN 22  CREATININE 1.15  CALCIUM 9.2  MG 2.0  GFRNONAA >60  ANIONGAP 9    Recent Labs  Lab 03/10/23 1620  PROT 6.4*  ALBUMIN 3.6  AST 78*  ALT 105*  ALKPHOS 46  BILITOT 0.8   Lipids No results for input(s): "CHOL", "TRIG", "HDL", "LABVLDL",  "LDLCALC", "CHOLHDL" in the last 168 hours. Hematology Recent Labs  Lab 03/10/23 1620  WBC 8.5  RBC 4.61  HGB 14.2  HCT 42.6  MCV 92.4  MCH 30.8  MCHC 33.3  RDW 14.5  PLT 134*   Thyroid  Recent Labs  Lab 03/10/23 1620  TSH 0.024*   BNPNo results for input(s): "BNP", "PROBNP" in the last 168 hours.  DDimer No results for input(s): "DDIMER" in the last 168 hours.   Radiology/Studies:  DG Chest Port 1 View  Result Date: 03/10/2023 CLINICAL DATA:  Fatigue EXAM: PORTABLE CHEST 1 VIEW COMPARISON:  10/01/2014 FINDINGS: Mild cardiomegaly. No acute airspace disease or effusion. No pneumothorax. IMPRESSION: No active disease. Mild cardiomegaly. Electronically Signed   By: Jasmine Pang M.D.   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 EKG shows 2:1 AV block with right bundle branch block and underlying heart rate of 33 bpm. Prior EKG from 2023 showed sinus bradycardia with right bundle branch block and left anterior fascicular block. Currently hemodynamically stable. Bradycardia likely contributing to fatigue. Discussed pacemaker placement to prevent further bradycardia and improve symptoms. Risks include infection, lung collapse, and need for sedation. Benefits include improved heart rate and reduced fatigue. Explained pacemaker will help maintain a safer heart rate around 50 bpm. Patient expressed understanding and desire to proceed to improve quality of life. - Order echocardiogram - Make NPO past midnight - Consult electrophysiology for pacemaker evaluation and placement - Monitor on telemetry overnight/ Zoll pads at bedside - If deteriorates, temp pacer wire discussed. Wife present.   Adrenal Insufficiency On hydrocortisone 10mg  AM and 5 mg PM. Took double dose due to concern for adrenal crisis but no signs of crisis present. Sodium and  potassium levels are within normal range. - Continue hydrocortisone as above  Hypothyroidism Status post pituitary macroadenoma resection, on levothyroxine 88 mcg daily. TSH is low at 0.024, consistent with history of pituitary removal. - Continue levothyroxine 88 mcg daily - Check Free T4 - Dr. Felipa Eth has been following  General Health Maintenance Prediabetes, hyperlipidemia, hypertension, and asthma. Currently on Crestor 5 mg at bedtime, benazepril 10 mg daily, and testosterone supplementation. - Continue current medications for prediabetes, hyperlipidemia, hypertension, and asthma  Risk Assessment/Risk Scores:     Code Status: Full Code  Severity of Illness: The appropriate patient status for this patient is INPATIENT. Inpatient status is judged to be reasonable and necessary in order to provide the required intensity of service to ensure the patient's safety. The patient's presenting symptoms, physical exam findings, and initial radiographic and laboratory data in the context of their chronic comorbidities is felt to place them at high risk for further clinical deterioration. Furthermore, it is not anticipated that the patient will be medically stable for discharge from the hospital within 2 midnights of admission.   * I certify that at the point of admission it is my clinical judgment that the patient will require inpatient hospital care spanning beyond 2 midnights from the point of admission due to high intensity of service, high risk for further deterioration and high frequency of surveillance required.*   For questions or updates, please contact [facility] Please consult [email or web address] for contact info under     Signed, Donato Schultz, MD  03/10/2023 8:43 PM

## 2023-03-11 ENCOUNTER — Inpatient Hospital Stay (HOSPITAL_COMMUNITY): Payer: Medicare Other

## 2023-03-11 ENCOUNTER — Encounter (HOSPITAL_COMMUNITY)
Admission: EM | Disposition: A | Discharge status: Home / Self Care | Payer: Self-pay | Source: Home / Self Care | Attending: Cardiology

## 2023-03-11 ENCOUNTER — Other Ambulatory Visit: Payer: Self-pay

## 2023-03-11 DIAGNOSIS — I441 Atrioventricular block, second degree: Secondary | ICD-10-CM | POA: Diagnosis not present

## 2023-03-11 DIAGNOSIS — I442 Atrioventricular block, complete: Principal | ICD-10-CM

## 2023-03-11 HISTORY — PX: PACEMAKER IMPLANT: EP1218

## 2023-03-11 LAB — ECHOCARDIOGRAM COMPLETE
AR max vel: 2.57 cm2
AV Area VTI: 2.31 cm2
AV Area mean vel: 2.47 cm2
AV Mean grad: 3 mm[Hg]
AV Peak grad: 6.3 mm[Hg]
Ao pk vel: 1.25 m/s
Area-P 1/2: 2.32 cm2
S' Lateral: 3.6 cm
Weight: 3192.26 [oz_av]

## 2023-03-11 LAB — CBC
HCT: 39.9 % (ref 39.0–52.0)
Hemoglobin: 13.1 g/dL (ref 13.0–17.0)
MCH: 29.7 pg (ref 26.0–34.0)
MCHC: 32.8 g/dL (ref 30.0–36.0)
MCV: 90.5 fL (ref 80.0–100.0)
Platelets: 127 10*3/uL — ABNORMAL LOW (ref 150–400)
RBC: 4.41 MIL/uL (ref 4.22–5.81)
RDW: 14.4 % (ref 11.5–15.5)
WBC: 7.3 10*3/uL (ref 4.0–10.5)
nRBC: 0 % (ref 0.0–0.2)

## 2023-03-11 LAB — BASIC METABOLIC PANEL
Anion gap: 9 (ref 5–15)
BUN: 17 mg/dL (ref 8–23)
CO2: 22 mmol/L (ref 22–32)
Calcium: 8.9 mg/dL (ref 8.9–10.3)
Chloride: 109 mmol/L (ref 98–111)
Creatinine, Ser: 0.86 mg/dL (ref 0.61–1.24)
GFR, Estimated: >60 mL/min (ref >60)
Glucose, Bld: 90 mg/dL (ref 70–99)
Potassium: 3.9 mmol/L (ref 3.5–5.1)
Sodium: 140 mmol/L (ref 135–145)

## 2023-03-11 LAB — SURGICAL PCR SCREEN
MRSA, PCR: NEGATIVE
Staphylococcus aureus: POSITIVE — AB

## 2023-03-11 SURGERY — PACEMAKER IMPLANT

## 2023-03-11 MED ORDER — CEFAZOLIN SODIUM-DEXTROSE 2-4 GM/100ML-% IV SOLN
INTRAVENOUS | Status: AC
  Filled 2023-03-11: qty 100

## 2023-03-11 MED ORDER — CHLORHEXIDINE GLUCONATE 4 % EX SOLN
60.0000 mL | Freq: Once | CUTANEOUS | Status: AC
  Administered 2023-03-11: 4 via TOPICAL
  Filled 2023-03-11: qty 30

## 2023-03-11 MED ORDER — MUPIROCIN 2 % EX OINT
1.0000 | TOPICAL_OINTMENT | Freq: Two times a day (BID) | CUTANEOUS | Status: DC
  Administered 2023-03-11 – 2023-03-12 (×3): 1 via NASAL
  Filled 2023-03-11: qty 22

## 2023-03-11 MED ORDER — BENAZEPRIL HCL 5 MG PO TABS
10.0000 mg | ORAL_TABLET | Freq: Every day | ORAL | Status: DC
  Administered 2023-03-12: 10 mg via ORAL
  Filled 2023-03-11: qty 2

## 2023-03-11 MED ORDER — SODIUM CHLORIDE 0.9% FLUSH: 10.0000 mL | Freq: Two times a day (BID) | INTRAVENOUS | Status: DC

## 2023-03-11 MED ORDER — MIDAZOLAM HCL 5 MG/5ML IJ SOLN
INTRAMUSCULAR | Status: DC | PRN
  Administered 2023-03-11 (×2): 1 mg via INTRAVENOUS
  Administered 2023-03-11: .5 mg via INTRAVENOUS
  Administered 2023-03-11: 1 mg via INTRAVENOUS

## 2023-03-11 MED ORDER — MIDAZOLAM HCL 5 MG/5ML IJ SOLN
INTRAMUSCULAR | Status: AC
  Filled 2023-03-11: qty 5

## 2023-03-11 MED ORDER — CEFAZOLIN SODIUM-DEXTROSE 2-4 GM/100ML-% IV SOLN
2.0000 g | INTRAVENOUS | Status: AC
  Administered 2023-03-11: 2 g via INTRAVENOUS
  Filled 2023-03-11: qty 100

## 2023-03-11 MED ORDER — PERFLUTREN LIPID MICROSPHERE
1.0000 mL | INTRAVENOUS | Status: AC | PRN
  Administered 2023-03-11: 3 mL via INTRAVENOUS

## 2023-03-11 MED ORDER — FENTANYL CITRATE (PF) 100 MCG/2ML IJ SOLN
INTRAMUSCULAR | Status: AC
  Filled 2023-03-11: qty 2

## 2023-03-11 MED ORDER — SODIUM CHLORIDE 0.9 % IV SOLN: INTRAVENOUS | Status: DC

## 2023-03-11 MED ORDER — FENTANYL CITRATE (PF) 100 MCG/2ML IJ SOLN
INTRAMUSCULAR | Status: DC | PRN
  Administered 2023-03-11 (×4): 50 ug via INTRAVENOUS

## 2023-03-11 MED ORDER — LIDOCAINE HCL (PF) 1 % IJ SOLN
INTRAMUSCULAR | Status: AC
  Filled 2023-03-11: qty 60

## 2023-03-11 MED ORDER — CEFAZOLIN SODIUM-DEXTROSE 1-4 GM/50ML-% IV SOLN
1.0000 g | Freq: Four times a day (QID) | INTRAVENOUS | Status: AC
  Administered 2023-03-11 – 2023-03-12 (×3): 1 g via INTRAVENOUS
  Filled 2023-03-11 (×4): qty 50

## 2023-03-11 MED ORDER — CHLORHEXIDINE GLUCONATE 4 % EX SOLN
60.0000 mL | Freq: Once | CUTANEOUS | Status: AC
  Administered 2023-03-11: 4 via TOPICAL

## 2023-03-11 MED ORDER — SODIUM CHLORIDE 0.9 % IV SOLN
INTRAVENOUS | Status: AC
  Filled 2023-03-11: qty 2

## 2023-03-11 MED ORDER — HEPARIN (PORCINE) IN NACL 1000-0.9 UT/500ML-% IV SOLN
INTRAVENOUS | Status: DC | PRN
  Administered 2023-03-11: 500 mL

## 2023-03-11 MED ORDER — MELATONIN 5 MG PO TABS
5.0000 mg | ORAL_TABLET | Freq: Every evening | ORAL | Status: DC | PRN
  Administered 2023-03-11: 5 mg via ORAL
  Filled 2023-03-11: qty 1

## 2023-03-11 MED ORDER — SODIUM CHLORIDE 0.9 % IV SOLN
80.0000 mg | INTRAVENOUS | Status: AC
  Administered 2023-03-11: 80 mg
  Filled 2023-03-11: qty 2

## 2023-03-11 MED ORDER — LIDOCAINE HCL (PF) 1 % IJ SOLN
INTRAMUSCULAR | Status: DC | PRN
  Administered 2023-03-11: 50 mL

## 2023-03-11 SURGICAL SUPPLY — 19 items
ADAPTER SEALING SSA-EW-09 (ADAPTER) IMPLANT
CABLE SURGICAL S-101-97-12 (CABLE) ×2 IMPLANT
CATH HIS SELECTSITE C304HIS (CATHETERS) IMPLANT
CATH RIGHTSITE C315HIS02 (CATHETERS) IMPLANT
IPG PACE AZUR XT DR MRI W1DR01 (Pacemaker) IMPLANT
LEAD CAPSURE NOVUS 5076-52CM (Lead) IMPLANT
LEAD SELECT SECURE 3830 383069 (Lead) IMPLANT
PACE AZURE XT DR MRI W1DR01 (Pacemaker) ×1 IMPLANT
PAD DEFIB RADIO PHYSIO CONN (PAD) ×2 IMPLANT
POUCH AIGIS-R ANTIBACT PPM (Mesh General) ×1 IMPLANT
POUCH AIGIS-R ANTIBACT PPM MED (Mesh General) IMPLANT
SELECT SECURE 3830 383069 (Lead) ×1 IMPLANT
SHEATH 7FR PRELUDE SNAP 13 (SHEATH) IMPLANT
SHEATH 9FR PRELUDE SNAP 13 (SHEATH) IMPLANT
SHEATH PROBE COVER 6X72 (BAG) IMPLANT
SLITTER 6232ADJ (MISCELLANEOUS) IMPLANT
TOOL TRANSVALV INSERT TVI-07 (MISCELLANEOUS) IMPLANT
TRAY PACEMAKER INSERTION (PACKS) ×2 IMPLANT
WIRE HI TORQ VERSACORE-J 145CM (WIRE) IMPLANT

## 2023-03-11 NOTE — Consult Note (Addendum)
ELECTROPHYSIOLOGY CONSULT NOTE    Patient ID: Randall Hudson MRN: [id number], DOB/AGE: 1941/02/28 82 y.o.  Admit date: 03/10/2023 Date of Consult: 03/11/2023  Primary Physician: Chilton Greathouse, MD Primary Cardiologist: None  Electrophysiologist: New   Referring Provider: Dr. Anne Fu  Patient Profile: Randall Hudson is a 82 y.o. male with a history of pre-DM, HLD, HTN, bifascicular block, hypothyroid, and adrenal insufficiency who is being seen today for the evaluation of symptomatic bradycardia/2nd degree AV block at the request of Dr. Anne Fu.  HPI:  Randall Hudson is a 82 y.o. male with medical history as above.   Pt has been feeling sluggish and fatigue at least since Monday. No chest pain, recent illnesses, N/V or diarrhea. He did take an extra dose of his hydrocortisone for two days to see if it helped, but not significant change. Seen by [facility] medical yesterday and found to be in 2:1 AV block; Offered 911 transport but wife was present and drove to ED via POV where they had to wait for sometime.  Pt was hemodynamically stable and did not require temp wire. No significant ectopy.  EP asked to see for PPM consideration  Currently, he is resting comfortably in bed. Wife at bedside. Repeats history above with no new complaints. Asks appropriate questions about procedure and follow up. Willing to proceed with PPM. No new symptoms at rest.   Labs Potassium3.9 (12/06 4132) Magnesium  2.0 (12/05 1620) Creatinine, ser  0.86 (12/06 0204) PLT  127* (12/06 0204) HGB  13.1 (12/06 0204) WBC 7.3 (12/06 0204) Troponin I (High Sensitivity)17 (12/05 1620).    Past Medical History:  Diagnosis Date   Actinic keratosis    Allergic rhinitis    Bronchitis    Degenerative joint disease    Diabetes mellitus without complication (HCC)    Epididymitis    Hyperlipemia    Hypertension      Surgical History:  Past Surgical History:  Procedure Laterality Date   BRAIN SURGERY     HERNIA  REPAIR     THROAT SURGERY       Medications Prior to Admission  Medication Sig Dispense Refill Last Dose   albuterol (VENTOLIN HFA) 108 (90 Base) MCG/ACT inhaler Inhale 1 puff into the lungs every 6 (six) hours as needed for shortness of breath.   Past Week   benazepril (LOTENSIN) 20 MG tablet Take 10 mg by mouth daily.   03/10/2023   Cholecalciferol 25 MCG (1000 UT) capsule Take 1 tablet by mouth every morning.   03/10/2023   Coenzyme Q10 (CO Q 10 PO) Take 1 tablet by mouth daily at 12 noon.   03/10/2023   dorzolamide-timolol (COSOPT) 22.3-6.8 MG/ML ophthalmic solution Place 1 drop into both eyes 2 (two) times daily.   03/10/2023   hydrocortisone (CORTEF) 5 MG tablet Take 5-10 mg by mouth See admin instructions. Take 2 tablets by mouth in the morning and then take 1 tablet by mouth at night per patient   03/10/2023   levothyroxine (SYNTHROID) 88 MCG tablet Take 88 mcg by mouth daily.   03/10/2023   rosuvastatin (CRESTOR) 5 MG tablet Take 5 mg by mouth at bedtime.   03/09/2023   tobramycin (TOBREX) 0.3 % ophthalmic solution Place 1 drop into both eyes See admin instructions. One drop in both eyes every 7 weeks per patient   unknown   testosterone cypionate (DEPOTESTOSTERONE CYPIONATE) 200 MG/ML injection Inject 200 mg into the muscle every 28 (twenty-eight) days.   unknown  Inpatient Medications:   Chlorhexidine Gluconate Cloth  6 each Topical Daily   hydrocortisone  10 mg Oral q AM   hydrocortisone  5 mg Oral QPM   levothyroxine  88 mcg Oral Daily   rosuvastatin  5 mg Oral QHS    Allergies: No Known Allergies  History reviewed. No pertinent family history.   Physical Exam: Vitals:   03/11/23 0610 03/11/23 0700 03/11/23 0758 03/11/23 0800  BP:  (!) 160/73  (!) 156/70  Pulse:  (!) 33  (!) 35  Resp:  15  17  Temp: 98.5 F (36.9 C)  98.6 F (37 C)   TempSrc: Oral  Oral   SpO2:  95%  96%  Weight:        GEN- NAD, A&O x 3, normal affect HEENT: Normocephalic, atraumatic Lungs-  CTAB, Normal effort.  Heart- Regular rate and rhythm, No M/G/R.  GI- Soft, NT, ND.  Extremities- No clubbing, cyanosis, or edema   Radiology/Studies: DG Chest Port 1 View  Result Date: 03/10/2023 CLINICAL DATA:  Fatigue EXAM: PORTABLE CHEST 1 VIEW COMPARISON:  10/01/2014 FINDINGS: Mild cardiomegaly. No acute airspace disease or effusion. No pneumothorax. IMPRESSION: No active disease. Mild cardiomegaly. Electronically Signed   By: Jasmine Pang M.D.   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  No clear reversible cause Explained risks, benefits, and alternatives to PPM implantation, including but not limited to bleeding, infection, pneumothorax, pericardial effusion, lead dislodgement, heart attack, stroke, or death.  Pt verbalized understanding and agrees to proceed.    Not on [facility]  Thyroid dysfunction Per PCP  Adrenal insufficiency Will reinforce wound care given somewhat high risk of infection on steroids.  Will discuss with MD if any other precautions need to be taken (such as antibiotic pouch or prophylactic course)  For questions or updates, please contact [facility] Please consult [email or web address] for contact info under Cardiology/STEMI.  Dustin Flock, PA-C  03/11/2023 8:55 AM

## 2023-03-11 NOTE — TOC Initial Note (Signed)
Transition of Care [facility]) - Initial/Assessment Note    Patient Details  Name: Randall Hudson MRN: [id number] Date of Birth: [birth year]  [facility] CM/SW Contact:    Elliot Cousin, RN Phone Number: [phone] 03/11/2023, 2:05 PM  Clinical Narrative:                 CM spoke to pt and was independent pta. Wife at home to assist with care. Gave permission to speak to wife as needed.  Scheduled for Pacemaker Implant today.  Will continue to follow for dc needs.   Expected Discharge Plan: Home/Self Care Barriers to Discharge: Continued Medical Work up   Patient Goals and CMS Choice Patient states their goals for this hospitalization and ongoing recovery are:: wants to get better          Expected Discharge Plan and Services   Discharge Planning Services: CM Consult   Living arrangements for the past 2 months: Single Family Home                                      Prior Living Arrangements/Services Living arrangements for the past 2 months: Single Family Home Lives with:: Spouse Patient language and need for interpreter reviewed:: Yes Do you feel safe going back to the place where you live?: Yes      Need for Family Participation in Patient Care: No (Comment) Care giver support system in place?: Yes (comment)   Criminal Activity/Legal Involvement Pertinent to Current Situation/Hospitalization: No - Comment as needed  Activities of Daily Living   ADL Screening (condition at time of admission) Independently performs ADLs?: Yes (appropriate for developmental age) Is the patient deaf or have difficulty hearing?: No Does the patient have difficulty seeing, even when wearing glasses/contacts?: No Does the patient have difficulty concentrating, remembering, or making decisions?: No  Permission Sought/Granted Permission sought to share information with : Case Manager, Family Supports, PCP Permission granted to share information with : Yes,  Verbal Permission Granted  Share Information with NAME: Virat Distad     Permission granted to share info w Relationship: wife  Permission granted to share info w Contact Information: [phone]  Emotional Assessment Appearance:: Appears stated age Attitude/Demeanor/Rapport: Engaged Affect (typically observed): Accepting Orientation: : Oriented to Self, Oriented to Place, Oriented to  Time, Oriented to Situation   Psych Involvement: No (comment)  Admission diagnosis:  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 [place] [address] Phone: [phone] Fax: [phone]     Social Determinants of Health (SDOH) Social History: SDOH Screenings   Food Insecurity: No Food Insecurity (03/11/2023)  Housing: Low Risk  (03/11/2023)  Transportation Needs: No Transportation Needs (03/11/2023)  Utilities: Not At Risk (03/11/2023)  Tobacco Use: Low Risk  (03/10/2023)   SDOH Interventions:     Readmission Risk  Interventions     No data to display

## 2023-03-11 NOTE — H&P (View-Only) (Signed)
ELECTROPHYSIOLOGY CONSULT NOTE    Patient ID: Randall Hudson MRN: [id number], DOB/AGE: 1941/02/28 82 y.o.  Admit date: 03/10/2023 Date of Consult: 03/11/2023  Primary Physician: Chilton Greathouse, MD Primary Cardiologist: None  Electrophysiologist: New   Referring Provider: Dr. Anne Fu  Patient Profile: Randall Hudson is a 82 y.o. male with a history of pre-DM, HLD, HTN, bifascicular block, hypothyroid, and adrenal insufficiency who is being seen today for the evaluation of symptomatic bradycardia/2nd degree AV block at the request of Dr. Anne Fu.  HPI:  Randall Hudson is a 82 y.o. male with medical history as above.   Pt has been feeling sluggish and fatigue at least since Monday. No chest pain, recent illnesses, N/V or diarrhea. He did take an extra dose of his hydrocortisone for two days to see if it helped, but not significant change. Seen by [facility] medical yesterday and found to be in 2:1 AV block; Offered 911 transport but wife was present and drove to ED via POV where they had to wait for sometime.  Pt was hemodynamically stable and did not require temp wire. No significant ectopy.  EP asked to see for PPM consideration  Currently, he is resting comfortably in bed. Wife at bedside. Repeats history above with no new complaints. Asks appropriate questions about procedure and follow up. Willing to proceed with PPM. No new symptoms at rest.   Labs Potassium3.9 (12/06 4132) Magnesium  2.0 (12/05 1620) Creatinine, ser  0.86 (12/06 0204) PLT  127* (12/06 0204) HGB  13.1 (12/06 0204) WBC 7.3 (12/06 0204) Troponin I (High Sensitivity)17 (12/05 1620).    Past Medical History:  Diagnosis Date   Actinic keratosis    Allergic rhinitis    Bronchitis    Degenerative joint disease    Diabetes mellitus without complication (HCC)    Epididymitis    Hyperlipemia    Hypertension      Surgical History:  Past Surgical History:  Procedure Laterality Date   BRAIN SURGERY     HERNIA  REPAIR     THROAT SURGERY       Medications Prior to Admission  Medication Sig Dispense Refill Last Dose   albuterol (VENTOLIN HFA) 108 (90 Base) MCG/ACT inhaler Inhale 1 puff into the lungs every 6 (six) hours as needed for shortness of breath.   Past Week   benazepril (LOTENSIN) 20 MG tablet Take 10 mg by mouth daily.   03/10/2023   Cholecalciferol 25 MCG (1000 UT) capsule Take 1 tablet by mouth every morning.   03/10/2023   Coenzyme Q10 (CO Q 10 PO) Take 1 tablet by mouth daily at 12 noon.   03/10/2023   dorzolamide-timolol (COSOPT) 22.3-6.8 MG/ML ophthalmic solution Place 1 drop into both eyes 2 (two) times daily.   03/10/2023   hydrocortisone (CORTEF) 5 MG tablet Take 5-10 mg by mouth See admin instructions. Take 2 tablets by mouth in the morning and then take 1 tablet by mouth at night per patient   03/10/2023   levothyroxine (SYNTHROID) 88 MCG tablet Take 88 mcg by mouth daily.   03/10/2023   rosuvastatin (CRESTOR) 5 MG tablet Take 5 mg by mouth at bedtime.   03/09/2023   tobramycin (TOBREX) 0.3 % ophthalmic solution Place 1 drop into both eyes See admin instructions. One drop in both eyes every 7 weeks per patient   unknown   testosterone cypionate (DEPOTESTOSTERONE CYPIONATE) 200 MG/ML injection Inject 200 mg into the muscle every 28 (twenty-eight) days.   unknown  Inpatient Medications:   Chlorhexidine Gluconate Cloth  6 each Topical Daily   hydrocortisone  10 mg Oral q AM   hydrocortisone  5 mg Oral QPM   levothyroxine  88 mcg Oral Daily   rosuvastatin  5 mg Oral QHS    Allergies: No Known Allergies  History reviewed. No pertinent family history.   Physical Exam: Vitals:   03/11/23 0610 03/11/23 0700 03/11/23 0758 03/11/23 0800  BP:  (!) 160/73  (!) 156/70  Pulse:  (!) 33  (!) 35  Resp:  15  17  Temp: 98.5 F (36.9 C)  98.6 F (37 C)   TempSrc: Oral  Oral   SpO2:  95%  96%  Weight:        GEN- NAD, A&O x 3, normal affect HEENT: Normocephalic, atraumatic Lungs-  CTAB, Normal effort.  Heart- Regular rate and rhythm, No M/G/R.  GI- Soft, NT, ND.  Extremities- No clubbing, cyanosis, or edema   Radiology/Studies: DG Chest Port 1 View  Result Date: 03/10/2023 CLINICAL DATA:  Fatigue EXAM: PORTABLE CHEST 1 VIEW COMPARISON:  10/01/2014 FINDINGS: Mild cardiomegaly. No acute airspace disease or effusion. No pneumothorax. IMPRESSION: No active disease. Mild cardiomegaly. Electronically Signed   By: Jasmine Pang M.D.   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  No clear reversible cause Explained risks, benefits, and alternatives to PPM implantation, including but not limited to bleeding, infection, pneumothorax, pericardial effusion, lead dislodgement, heart attack, stroke, or death.  Pt verbalized understanding and agrees to proceed.    Not on [facility]  Thyroid dysfunction Per PCP  Adrenal insufficiency Will reinforce wound care given somewhat high risk of infection on steroids.  Will discuss with MD if any other precautions need to be taken (such as antibiotic pouch or prophylactic course)  For questions or updates, please contact [facility] Please consult [email or web address] for contact info under Cardiology/STEMI.  Dustin Flock, PA-C  03/11/2023 8:55 AM

## 2023-03-11 NOTE — Progress Notes (Signed)
  Echocardiogram 2D Echocardiogram has been performed.  Reinaldo Raddle Debralee Braaksma 03/11/2023, 12:01 PM

## 2023-03-11 NOTE — Plan of Care (Signed)
  Problem: Education: Goal: Knowledge of General Education information will improve Description: Including pain rating scale, medication(s)/side effects and non-pharmacologic comfort measures Outcome: Progressing   Problem: Health Behavior/Discharge Planning: Goal: Ability to manage health-related needs will improve Outcome: Progressing   Problem: Clinical Measurements: Goal: Ability to maintain clinical measurements within normal limits will improve Outcome: Progressing Goal: Will remain free from infection Outcome: Progressing Goal: Diagnostic test results will improve Outcome: Progressing Goal: Respiratory complications will improve Outcome: Progressing Goal: Cardiovascular complication will be avoided Outcome: Progressing   Problem: Activity: Goal: Risk for activity intolerance will decrease Outcome: Progressing   Problem: Nutrition: Goal: Adequate nutrition will be maintained Outcome: Progressing   Problem: Coping: Goal: Level of anxiety will decrease Outcome: Progressing   Problem: Elimination: Goal: Will not experience complications related to bowel motility Outcome: Progressing Goal: Will not experience complications related to urinary retention Outcome: Progressing   Problem: Pain Management: Goal: General experience of comfort will improve Outcome: Progressing   Problem: Safety: Goal: Ability to remain free from injury will improve Outcome: Progressing   Problem: Skin Integrity: Goal: Risk for impaired skin integrity will decrease Outcome: Progressing   Problem: Education: Goal: Understanding of cardiac disease, CV risk reduction, and recovery process will improve Outcome: Progressing Goal: Individualized Educational Video(s) Outcome: Progressing   Problem: Activity: Goal: Ability to tolerate increased activity will improve Outcome: Progressing   Problem: Cardiac: Goal: Ability to achieve and maintain adequate cardiovascular perfusion will  improve Outcome: Progressing   Problem: Health Behavior/Discharge Planning: Goal: Ability to safely manage health-related needs after discharge will improve Outcome: Progressing   Problem: Education: Goal: Knowledge of cardiac device and self-care will improve Outcome: Progressing Goal: Ability to safely manage health related needs after discharge will improve Outcome: Progressing Goal: Individualized Educational Video(s) Outcome: Progressing   Problem: Cardiac: Goal: Ability to achieve and maintain adequate cardiopulmonary perfusion will improve Outcome: Progressing

## 2023-03-11 NOTE — Interval H&P Note (Signed)
History and Physical Interval Note:  03/11/2023 2:06 PM  Randall Hudson  has presented today for surgery, with the diagnosis of av block.  The various methods of treatment have been discussed with the patient and family. After consideration of risks, benefits and other options for treatment, the patient has consented to  Procedure(s): PACEMAKER IMPLANT (N/A) as a surgical intervention.  The patient's history has been reviewed, patient examined, no change in status, stable for surgery.  I have reviewed the patient's chart and labs.  Questions were answered to the patient's satisfaction.     Nobie Putnam

## 2023-03-11 NOTE — Discharge Instructions (Signed)
After Your Pacemaker   ACTIVITY Do not lift your arm above shoulder height for 1 week after your procedure. After 7 days, you may progress as below.  You should remove your sling 24 hours after your procedure, unless otherwise instructed by your provider.     Friday March 18, 2023  Saturday March 19, 2023 Sunday March 20, 2023 Monday March 21, 2023   Do not lift, push, pull, or carry anything over 10 pounds with the affected arm until 6 weeks (Friday April 22, 2023 ) after your procedure.   You may drive AFTER your wound check, unless you have been told otherwise by your provider.   Ask your healthcare provider when you can go back to work   INCISION/Dressing If you are on a blood thinner such as Coumadin, Xarelto, Eliquis, Plavix, or Pradaxa please confirm with your provider when this should be resumed.   If large square, outer bandage is left in place, this can be removed after 24 hours from your procedure. Do not remove steri-strips or glue as below.   If a PRESSURE DRESSING (a bulky dressing that usually goes up over your shoulder) was applied or left in place, please follow instructions given by your provider on when to return to have this removed.   Monitor your Pacemaker site for redness, swelling, and drainage. Call the device clinic at [phone] if you experience these symptoms or fever/chills.  If your incision is sealed with Steri-strips or staples, you may shower 7 days after your procedure or when told by your provider. Do not remove the steri-strips or let the shower hit directly on your site. You may wash around your site with soap and water.    If you were discharged in a sling, please do not wear this during the day more than 48 hours after your surgery unless otherwise instructed. This may increase the risk of stiffness and soreness in your shoulder.   Avoid lotions, ointments, or perfumes over your incision until it is well-healed.  You may use a  hot tub or a pool AFTER your wound check appointment if the incision is completely closed.  Pacemaker Alerts:  Some alerts are vibratory and others beep. These are NOT emergencies. Please call our office to let us know. If this occurs at night or on weekends, it can wait until the next business day. Send a remote transmission.  If your device is capable of reading fluid status (for heart failure), you will be offered monthly monitoring to review this with you.   DEVICE MANAGEMENT Remote monitoring is used to monitor your pacemaker from home. This monitoring is scheduled every 91 days by our office. It allows [place] to keep an eye on the functioning of your device to ensure it is working properly. You will routinely see your Electrophysiologist annually (more often if necessary).   You should receive your ID card for your new device in 4-8 weeks. Keep this card with you at all times once received. Consider wearing a medical alert bracelet or necklace.  Your Pacemaker may be MRI compatible. This will be discussed at your next office visit/wound check.  You should avoid contact with strong electric or magnetic fields.   Do not use amateur (ham) radio equipment or electric (arc) welding torches. MP3 player headphones with magnets should not be used. Some devices are safe to use if held at least 12 inches (30 cm) from your Pacemaker. These include power tools, lawn mowers, and speakers. If you are  unsure if something is safe to use, ask your health care provider.  When using your cell phone, hold it to the ear that is on the opposite side from the Pacemaker. Do not leave your cell phone in a pocket over the Pacemaker.  You may safely use electric blankets, heating pads, computers, and microwave ovens.  Call the office right away if: You have chest pain. You feel more short of breath than you have felt before. You feel more light-headed than you have felt before. Your incision starts to open up.  This  information is not intended to replace advice given to you by your health care provider. Make sure you discuss any questions you have with your health care provider.

## 2023-03-12 ENCOUNTER — Inpatient Hospital Stay (HOSPITAL_COMMUNITY): Payer: Medicare Other

## 2023-03-12 DIAGNOSIS — I442 Atrioventricular block, complete: Secondary | ICD-10-CM | POA: Diagnosis present

## 2023-03-12 DIAGNOSIS — Z950 Presence of cardiac pacemaker: Secondary | ICD-10-CM

## 2023-03-12 NOTE — Discharge Summary (Signed)
Discharge Summary    Patient ID: [name] MRN: [id number]; DOB: 05/29/1940  Admit date: 03/10/2023 Discharge date: 03/12/2023  PCP:  Chilton Greathouse, MD   [facility] Providers Cardiologist:  Tessa Lerner, DO  Electrophysiologist:  Nobie Putnam, MD       Discharge Diagnoses    Principal Problem:   2nd degree AV block Active Problems:   Heart block   Complete heart block [facility])    Diagnostic Studies/Procedures    PACEMAKER IMPLANT 03/11/2023 CONCLUSIONS:   1. Successful dual chamber pacemaker with LBBAP lead implant.   2. No early apparent complications.   3. Leave pressure dressing in place for 24 hours.     RV lead (model [id number], serial [id number]) was passed through the sheath and fixed to the RV septum.     Right atrial lead (model 5076, serial [id number]) was retracted and the lead was withdrawn to the right atrium, then positioned into the RA appendage where it displayed excellent pacing 0.7 V at 0.18ms) and sensing (2.8 mV) thresholds with an acceptable impedance (671 ohms).     The leads were then connected to a pulse generator (model Azure XT DR MRI SureScan, serial [id number]).   ECHO: 03/11/2023  1. Left ventricular ejection fraction, by estimation, is 60 to 65%. The left ventricle has normal function. The left ventricle has no regional wall motion abnormalities. There is mild left ventricular hypertrophy. Left ventricular diastolic parameters are consistent with Grade II diastolic dysfunction (pseudonormalization).   2. Right ventricular systolic function was not well visualized. The right ventricular size is not well visualized. Tricuspid regurgitation signal is inadequate for assessing PA pressure.   3. The mitral valve is grossly normal. Trivial mitral valve  regurgitation.   4. The aortic valve was not well visualized. Aortic valve regurgitation is not visualized.   5. Aneurysm of the ascending aorta, measuring 40 mm.   6. The inferior vena cava  is normal in size with greater than 50% respiratory variability, suggesting right atrial pressure of 3 mmHg.  _____________   History of Present Illness     Randall Hudson is a 82 y.o. male with pre-DM, HLD, HTN, bifascicular block, hypothyroid, and adrenal insufficiency.  He was sent to the ER by his PCP for symptomatic bradycardia. ECG showed CHB with idioventricular rhythm and a HR of 34. Pacemaker was indicated and he was admitted for the procedure on 12/05.    Hospital Course     Consultants: EP   His BP and mental status were stable, he did not require external pacing.   He was seen by EP and take to the lab for a PPM on 12/06. He had a dual lead PPM implanted w/out complication.   On 12/07, he was seen by Dr Jimmey Ralph and all data were reviewed. His CXR showed normal lead placement, w/out PTX.  The device was functioning normally by device interrogation.   His TSH was low, but free T4 was normal, continue current Synthroid dose and f/u with PCP.   No further workup is indicated and he is considered stable for discharge, to f/u as an outpt.      Did the patient have an acute coronary syndrome (MI, NSTEMI, STEMI, etc) this admission?:  No                               Did the patient have a percutaneous coronary intervention (stent / angioplasty)?:  No.    _____________  Discharge Vitals Blood pressure 139/83, pulse 72, temperature 99.6 F (37.6 C), temperature source Oral, resp. rate 18, weight 90.5 kg, SpO2 93%.  Filed Weights   03/10/23 2115  Weight: 90.5 kg    Labs & Radiologic Studies    CBC Recent Labs    03/10/23 1620 03/11/23 0204  WBC 8.5 7.3  NEUTROABS 5.1  --   HGB 14.2 13.1  HCT 42.6 39.9  MCV 92.4 90.5  PLT 134* 127*   Basic Metabolic Panel Recent Labs    16/10/96 1620 03/11/23 0204  NA 140 140  K 4.2 3.9  CL 110 109  CO2 21* 22  GLUCOSE 106* 90  BUN 22 17  CREATININE 1.15 0.86  CALCIUM 9.2 8.9  MG 2.0  --    Liver Function  Tests Recent Labs    03/10/23 1620  AST 78*  ALT 105*  ALKPHOS 46  BILITOT 0.8  PROT 6.4*  ALBUMIN 3.6   No results for input(s): "LIPASE", "AMYLASE" in the last 72 hours. High Sensitivity Troponin:   Recent Labs  Lab 03/10/23 1620  TROPONINIHS 17    Thyroid Function Tests Recent Labs    03/10/23 1620  TSH 0.024*   Free T4  Date Value Ref Range Status  03/10/2023 0.96 0.61 - 1.12 ng/dL Final    Comment:    (NOTE) Biotin ingestion may interfere with free T4 tests. If the results are inconsistent with the TSH level, previous test results, or the clinical presentation, then consider biotin interference. If needed, order repeat testing after stopping biotin. Performed at [facility], [address] [place], [place] [address]     _____________  DG Chest 2 View  Result Date: 03/12/2023 CLINICAL DATA:  82 year old male cardiac device placed. EXAM: CHEST - 2 VIEW COMPARISON:  Portable chest 03/10/2023 and earlier. FINDINGS: PA and lateral view 0554 hours. New left chest dual lead cardiac pacemaker. Leads course to the right atrium and RV apex region. Left upper chest clothing artifact. No pneumothorax identified. No pulmonary edema. But there is evidence of small pleural effusion(s) on the lateral view. Cardiac size is at the upper limits of normal. Tortuosity of the thoracic aorta. Other mediastinal contours are within normal limits. Visualized tracheal air column is within normal limits. Left lower neck surgical clips. No acute osseous abnormality identified. Negative visible bowel gas. IMPRESSION: 1. New left chest dual lead cardiac pacemaker. No pneumothorax or adverse features. 2. Small pleural effusions. Electronically Signed   By: Odessa Fleming M.D.   On: 03/12/2023 06:47   EP PPM/ICD IMPLANT  Result Date: 03/11/2023  CONCLUSIONS:  1. Successful dual chamber pacemaker with LBBAP lead implant.  2. No early apparent complications.  3. Leave pressure dressing in place for  24 hours. Nobie Putnam, MD Cardiac Electrophysiology   ECHOCARDIOGRAM COMPLETE  Result Date: 03/11/2023    ECHOCARDIOGRAM REPORT   Patient Name:   Randall Hudson Date of Exam: 03/11/2023 Medical Rec #:  [id number]      Height:       70.7 in Accession #:    [id number]     Weight:       199.5 lb Date of Birth:  08/05/1940     BSA:          2.101 m Patient Age:    82 years       BP:           156/70 mmHg Patient Gender: M  HR:           35 bpm. Exam Location:  Inpatient Procedure: 2D Echo, Cardiac Doppler, Color Doppler and Intracardiac            Opacification Agent Indications:    Heart Block, 2nd Degree  History:        Patient has prior history of Echocardiogram examinations, most                 recent 02/22/2013. Arrythmias:Bradycardia.  Sonographer:    Karma Ganja Referring Phys: Arty Baumgartner  Sonographer Comments: Suboptimal parasternal window and suboptimal apical window. Image acquisition challenging due to patient body habitus. IMPRESSIONS  1. Left ventricular ejection fraction, by estimation, is 60 to 65%. The left ventricle has normal function. The left ventricle has no regional wall motion abnormalities. There is mild left ventricular hypertrophy. Left ventricular diastolic parameters are consistent with Grade II diastolic dysfunction (pseudonormalization).  2. Right ventricular systolic function was not well visualized. The right ventricular size is not well visualized. Tricuspid regurgitation signal is inadequate for assessing PA pressure.  3. The mitral valve is grossly normal. Trivial mitral valve regurgitation.  4. The aortic valve was not well visualized. Aortic valve regurgitation is not visualized.  5. Aneurysm of the ascending aorta, measuring 40 mm.  6. The inferior vena cava is normal in size with greater than 50% respiratory variability, suggesting right atrial pressure of 3 mmHg. FINDINGS  Left Ventricle: Left ventricular ejection fraction, by estimation, is 60 to 65%.  The left ventricle has normal function. The left ventricle has no regional wall motion abnormalities. Definity contrast agent was given IV to delineate the left ventricular  endocardial borders. The left ventricular internal cavity size was normal in size. There is mild left ventricular hypertrophy. Left ventricular diastolic parameters are consistent with Grade II diastolic dysfunction (pseudonormalization). Right Ventricle: The right ventricular size is not well visualized. Right ventricular systolic function was not well visualized. Tricuspid regurgitation signal is inadequate for assessing PA pressure. Left Atrium: Left atrial size was not well visualized. Right Atrium: Right atrial size was not well visualized. Pericardium: There is no evidence of pericardial effusion. Mitral Valve: The mitral valve is grossly normal. Trivial mitral valve regurgitation. Tricuspid Valve: Tricuspid valve regurgitation is not demonstrated. Aortic Valve: The aortic valve was not well visualized. Aortic valve regurgitation is not visualized. Aortic valve mean gradient measures 3.0 mmHg. Aortic valve peak gradient measures 6.2 mmHg. Aortic valve area, by VTI measures 2.31 cm. Pulmonic Valve: Pulmonic valve regurgitation is not visualized. Aorta: There is an aneurysm involving the ascending aorta measuring 40 mm. Venous: The inferior vena cava is normal in size with greater than 50% respiratory variability, suggesting right atrial pressure of 3 mmHg. IAS/Shunts: The interatrial septum was not well visualized.  LEFT VENTRICLE PLAX 2D LVIDd:         6.00 cm   Diastology LVIDs:         3.60 cm   LV e' medial:    8.38 cm/s LV PW:         1.10 cm   LV E/e' medial:  11.7 LV IVS:        1.20 cm   LV e' lateral:   7.72 cm/s LVOT diam:     2.10 cm   LV E/e' lateral: 12.7 LV SV:         72 LV SV Index:   34 LVOT Area:     3.46 cm  RIGHT VENTRICLE  IVC RV Basal diam:  4.50 cm    IVC diam: 1.30 cm RV S prime:     7.29 cm/s TAPSE  (M-mode): 2.8 cm LEFT ATRIUM              Index        RIGHT ATRIUM           Index LA diam:        3.90 cm  1.86 cm/m   RA Area:     20.00 cm LA Vol (A2C):   132.0 ml 62.82 ml/m  RA Volume:   67.70 ml  32.22 ml/m LA Vol (A4C):   97.1 ml  46.21 ml/m LA Biplane Vol: 116.0 ml 55.20 ml/m  AORTIC VALVE AV Area (Vmax):    2.57 cm AV Area (Vmean):   2.47 cm AV Area (VTI):     2.31 cm AV Vmax:           125.00 cm/s AV Vmean:          80.100 cm/s AV VTI:            0.314 m AV Peak Grad:      6.2 mmHg AV Mean Grad:      3.0 mmHg LVOT Vmax:         92.80 cm/s LVOT Vmean:        57.100 cm/s LVOT VTI:          0.209 m LVOT/AV VTI ratio: 0.67  AORTA Ao Root diam: 3.40 cm Ao Asc diam:  4.00 cm MITRAL VALVE MV Area (PHT): 2.32 cm    SHUNTS MV Decel Time: 327 msec    Systemic VTI:  0.21 m MV E velocity: 98.00 cm/s  Systemic Diam: 2.10 cm MV A velocity: 88.00 cm/s MV E/A ratio:  1.11 Photographer signed by Carolan Clines Signature Date/Time: 03/11/2023/12:12:44 PM    Final    DG Chest Port 1 View  Result Date: 03/10/2023 CLINICAL DATA:  Fatigue EXAM: PORTABLE CHEST 1 VIEW COMPARISON:  10/01/2014 FINDINGS: Mild cardiomegaly. No acute airspace disease or effusion. No pneumothorax. IMPRESSION: No active disease. Mild cardiomegaly. Electronically Signed   By: Jasmine Pang M.D.   On: 03/10/2023 17:38   Disposition   Pt is being discharged home today in good condition.  Follow-up Plans & Appointments     Discharge Instructions     Call MD for:  redness, tenderness, or signs of infection (pain, swelling, redness, odor or green/yellow discharge around incision site)   Complete by: As directed    Diet - low sodium heart healthy   Complete by: As directed    Increase activity slowly   Complete by: As directed         Discharge Medications   Allergies as of 03/12/2023   No Known Allergies      Medication List     TAKE these medications    albuterol 108 (90 Base) MCG/ACT  inhaler Commonly known as: VENTOLIN HFA Inhale 1 puff into the lungs every 6 (six) hours as needed for shortness of breath.   benazepril 20 MG tablet Commonly known as: LOTENSIN Take 10 mg by mouth daily.   Cholecalciferol 25 MCG (1000 UT) capsule Take 1 tablet by mouth every morning.   CO Q 10 PO Take 1 tablet by mouth daily at 12 noon.   dorzolamide-timolol 2-0.5 % ophthalmic solution Commonly known as: COSOPT Place 1 drop into both eyes 2 (two) times daily.   hydrocortisone 5 MG  tablet Commonly known as: CORTEF Take 5-10 mg by mouth See admin instructions. Take 2 tablets by mouth in the morning and then take 1 tablet by mouth at night per patient   levothyroxine 88 MCG tablet Commonly known as: SYNTHROID Take 88 mcg by mouth daily.   rosuvastatin 5 MG tablet Commonly known as: CRESTOR Take 5 mg by mouth at bedtime.   testosterone cypionate 200 MG/ML injection Commonly known as: DEPOTESTOSTERONE CYPIONATE Inject 200 mg into the muscle every 28 (twenty-eight) days.   tobramycin 0.3 % ophthalmic solution Commonly known as: TOBREX Place 1 drop into both eyes See admin instructions. One drop in both eyes every 7 weeks per patient           Outstanding Labs/Studies   None  Duration of Discharge Encounter   Greater than 30 minutes including physician time.  Signed, Theodore Demark, PA-C 03/12/2023, 10:57 AM

## 2023-03-12 NOTE — Progress Notes (Addendum)
   Patient Name: Randall Hudson Date of Encounter: 03/12/2023 [facility] Cardiologist: None   Interval Summary  .    Pacemaker implant yesterday. No acute overnight events. Some left chest soreness. Otherwise well.   Vital Signs .    Vitals:   03/12/23 0300 03/12/23 0400 03/12/23 0500 03/12/23 0600  BP: (!) 161/83 (!) 150/89 (!) 174/89   Pulse: 69 70 69   Resp: 19 (!) 24 (!) 23 13  Temp:      TempSrc:      SpO2: 93% 91% 94%   Weight:        Intake/Output Summary (Last 24 hours) at 03/12/2023 0735 Last data filed at 03/12/2023 0600 Gross per 24 hour  Intake 1187.58 ml  Output 1400 ml  Net -212.42 ml      03/10/2023    9:15 PM 01/05/2023   11:25 AM 01/06/2022   10:41 AM  Last 3 Weights  Weight (lbs) 199 lb 8.3 oz 193 lb 191 lb 12.8 oz  Weight (kg) 90.5 kg 87.544 kg 87 kg      Telemetry/ECG    AP VP - Personally Reviewed  Physical Exam .   GEN: No acute distress.   Neck: No JVD Cardiac: Normal rate, regular rhythm. Left chest pocket without hematoma.  Respiratory: Clear to auscultation bilaterally. GI: Soft, nontender, non-distended  MS: No edema  EKG:    Device check:  RA - threshold 0.5V @0 .4ms, sensing 2.30mV, impedance 532ohms - 86.7% AP RV - threshold 0.5V @0 .4ms, sensing - no underlying at VVI 40bpm, impedance 684 ohms - 100% VP   Assessment & Plan .     Assessment: Randall Hudson is a 82 y.o. male with a history of pre-DM, HLD, HTN, bifascicular block, hypothyroid, and adrenal insufficiency who was seen for the evaluation of symptomatic bradycardia at the request of Dr. Anne Fu.   Patient had high grade AV block, intermittent CHB and underwent DDD PPM on 12/6. During implant, patient had brady dependent TdP requiring external pacing. Did well otherwise.    Problem List:  High grade AV block Symptomatic bradycardia Adrenal insufficiency Hypothyroidism - normal free T4 today   Plan: - CXR without pneumothorax. Appropriate lead position.  -  Device check performed this morning with normal function and stable lead parameters.  - Discharge teaching done.   For questions or updates, please contact [facility] Please consult [email or web address] for contact info under        Signed, Nobie Putnam, MD

## 2023-03-12 NOTE — Plan of Care (Signed)
Problem: Education: Goal: Knowledge of General Education information will improve Description: Including pain rating scale, medication(s)/side effects and non-pharmacologic comfort measures 03/12/2023 0425 by Zacarias Pontes, RN Outcome: Progressing 03/11/2023 2012 by Zacarias Pontes, RN Outcome: Progressing   Problem: Health Behavior/Discharge Planning: Goal: Ability to manage health-related needs will improve 03/12/2023 0425 by Zacarias Pontes, RN Outcome: Progressing 03/11/2023 2012 by Zacarias Pontes, RN Outcome: Progressing   Problem: Clinical Measurements: Goal: Ability to maintain clinical measurements within normal limits will improve 03/12/2023 0425 by Zacarias Pontes, RN Outcome: Progressing 03/11/2023 2012 by Zacarias Pontes, RN Outcome: Progressing Goal: Will remain free from infection 03/12/2023 0425 by Zacarias Pontes, RN Outcome: Progressing 03/11/2023 2012 by Zacarias Pontes, RN Outcome: Progressing Goal: Diagnostic test results will improve 03/12/2023 0425 by Zacarias Pontes, RN Outcome: Progressing 03/11/2023 2012 by Zacarias Pontes, RN Outcome: Progressing Goal: Respiratory complications will improve 03/12/2023 0425 by Zacarias Pontes, RN Outcome: Progressing 03/11/2023 2012 by Zacarias Pontes, RN Outcome: Progressing Goal: Cardiovascular complication will be avoided 03/12/2023 0425 by Zacarias Pontes, RN Outcome: Progressing 03/11/2023 2012 by Zacarias Pontes, RN Outcome: Progressing   Problem: Activity: Goal: Risk for activity intolerance will decrease 03/12/2023 0425 by Zacarias Pontes, RN Outcome: Progressing 03/11/2023 2012 by Zacarias Pontes, RN Outcome: Progressing   Problem: Nutrition: Goal: Adequate nutrition will be maintained 03/12/2023 0425 by Zacarias Pontes, RN Outcome: Progressing 03/11/2023 2012 by Zacarias Pontes, RN Outcome: Progressing   Problem: Coping: Goal: Level of anxiety will decrease 03/12/2023 0425 by Zacarias Pontes, RN Outcome:  Progressing 03/11/2023 2012 by Zacarias Pontes, RN Outcome: Progressing   Problem: Elimination: Goal: Will not experience complications related to bowel motility 03/12/2023 0425 by Zacarias Pontes, RN Outcome: Progressing 03/11/2023 2012 by Zacarias Pontes, RN Outcome: Progressing Goal: Will not experience complications related to urinary retention 03/12/2023 0425 by Zacarias Pontes, RN Outcome: Progressing 03/11/2023 2012 by Zacarias Pontes, RN Outcome: Progressing   Problem: Pain Management: Goal: General experience of comfort will improve 03/12/2023 0425 by Zacarias Pontes, RN Outcome: Progressing 03/11/2023 2012 by Zacarias Pontes, RN Outcome: Progressing   Problem: Safety: Goal: Ability to remain free from injury will improve 03/12/2023 0425 by Zacarias Pontes, RN Outcome: Progressing 03/11/2023 2012 by Zacarias Pontes, RN Outcome: Progressing   Problem: Skin Integrity: Goal: Risk for impaired skin integrity will decrease 03/12/2023 0425 by Zacarias Pontes, RN Outcome: Progressing 03/11/2023 2012 by Zacarias Pontes, RN Outcome: Progressing   Problem: Education: Goal: Understanding of cardiac disease, CV risk reduction, and recovery process will improve 03/12/2023 0425 by Zacarias Pontes, RN Outcome: Progressing 03/11/2023 2012 by Zacarias Pontes, RN Outcome: Progressing Goal: Individualized Educational Video(s) 03/12/2023 0425 by Zacarias Pontes, RN Outcome: Progressing 03/11/2023 2012 by Zacarias Pontes, RN Outcome: Progressing   Problem: Activity: Goal: Ability to tolerate increased activity will improve 03/12/2023 0425 by Zacarias Pontes, RN Outcome: Progressing 03/11/2023 2012 by Zacarias Pontes, RN Outcome: Progressing   Problem: Cardiac: Goal: Ability to achieve and maintain adequate cardiovascular perfusion will improve 03/12/2023 0425 by Zacarias Pontes, RN Outcome: Progressing 03/11/2023 2012 by Zacarias Pontes, RN Outcome: Progressing   Problem: Health  Behavior/Discharge Planning: Goal: Ability to safely manage health-related needs after discharge will improve 03/12/2023 0425 by Zacarias Pontes, RN Outcome: Progressing 03/11/2023 2012 by Zacarias Pontes, RN Outcome: Progressing   Problem: Education: Goal: Knowledge of cardiac  device and self-care will improve 03/12/2023 0425 by Zacarias Pontes, RN Outcome: Progressing 03/11/2023 2012 by Zacarias Pontes, RN Outcome: Progressing Goal: Ability to safely manage health related needs after discharge will improve 03/12/2023 0425 by Zacarias Pontes, RN Outcome: Progressing 03/11/2023 2012 by Zacarias Pontes, RN Outcome: Progressing Goal: Individualized Educational Video(s) 03/12/2023 0425 by Zacarias Pontes, RN Outcome: Progressing 03/11/2023 2012 by Zacarias Pontes, RN Outcome: Progressing   Problem: Cardiac: Goal: Ability to achieve and maintain adequate cardiopulmonary perfusion will improve 03/12/2023 0425 by Zacarias Pontes, RN Outcome: Progressing 03/11/2023 2012 by Zacarias Pontes, RN Outcome: Progressing   Problem: Education: Goal: Knowledge of cardiac device and self-care will improve Outcome: Progressing Goal: Ability to safely manage health related needs after discharge will improve Outcome: Progressing Goal: Individualized Educational Video(s) Outcome: Progressing   Problem: Cardiac: Goal: Ability to achieve and maintain adequate cardiopulmonary perfusion will improve Outcome: Progressing

## 2023-03-14 ENCOUNTER — Encounter (HOSPITAL_COMMUNITY): Payer: Self-pay | Admitting: Cardiology

## 2023-03-14 ENCOUNTER — Telehealth: Payer: Self-pay

## 2023-03-14 NOTE — Telephone Encounter (Signed)
Pt called c/o tingling in left arm, pain, and slight swelling when compared to RUE in hand. Reviewed w/ JP. Relayed to pt to monitor it and it should not be getting worse (increased swelling/pain/sensation loss) but should slowly improve. Advised to take scheduled tylenol q6 for a 2 days or so and PRN following. Pt acknowledges to call if symptoms worsen at which point we will set up an [place] of LUE.

## 2023-03-15 ENCOUNTER — Telehealth: Payer: Self-pay

## 2023-03-15 NOTE — Telephone Encounter (Signed)
Follow-up after same day discharge: Implant date: 03/11/2023 MD: Nobie Putnam Device: Jamse Mead DR MRI Location: Left Chest   Wound check visit: 03/24/2023 90 day MD follow-up: 07/04/2023  Remote Transmission received:Yes  Dressing/sling removed: yes  Confirm OAC restart on: Pt is taking medicines  Please continue to monitor your cardiac device site for redness, swelling, and drainage. Call the device clinic at [phone] if you experience these symptoms, fever/chills, or have questions about your device.   Remote monitoring is used to monitor your cardiac device from home. This monitoring is scheduled every 91 days by our office. It allows [place] to keep an eye on the functioning of your device to ensure it is working properly.

## 2023-03-21 NOTE — Progress Notes (Unsigned)
Cardiology Office Note:  .   Date:  03/21/2023  ID:  Randall Hudson, DOB 1940-05-05, MRN [id number] PCP: Chilton Greathouse, MD  [facility] Providers Cardiologist:  Tessa Lerner, DO Electrophysiologist:  Nobie Putnam, MD {  History of Present Illness: .   Randall Hudson is a 82 y.o. male w/PMHx of asthma, HTN, HLD, pituitary macroadenoma status resection, hypothyroidism, advanced heart block w/PPM  Admitted 03/10/23 with progressive weakness, DOE, found in advanced heart block and underwent PPM implant Discharged 03/12/23  Today's visit is scheduled as a 10-14 day wound check visit  ROS:   His is accompanied by his wife Doing very well No CP, palpitations or cardiac awareness No SOB, DOE No near syncope or syncope Minor aching continues around his device, mostly towards his shoulder   Device information MDT dual chamber PPM implanted 03/11/23 RV lead is in LB position   Studies Reviewed: Marland Kitchen    EKG not done today  DEVICE interrogation done today and reviewed by myself Battery and lead measurements are good No arrhythmias Acute implant outputs noted, unchanged  03/11/23: TTE 1. Left ventricular ejection fraction, by estimation, is 60 to 65%. The  left ventricle has normal function. The left ventricle has no regional  wall motion abnormalities. There is mild left ventricular hypertrophy.  Left ventricular diastolic parameters  are consistent with Grade II diastolic dysfunction (pseudonormalization).   2. Right ventricular systolic function was not well visualized. The right  ventricular size is not well visualized. Tricuspid regurgitation signal is  inadequate for assessing PA pressure.   3. The mitral valve is grossly normal. Trivial mitral valve  regurgitation.   4. The aortic valve was not well visualized. Aortic valve regurgitation  is not visualized.   5. Aneurysm of the ascending aorta, measuring 40 mm.   6. The inferior vena cava is normal in size with  greater than 50%  respiratory variability, suggesting right atrial pressure of 3 mmHg.     Echocardiogram: 12/29/2021:  Study Quality: Technically difficult study.  Normal LV systolic function with visual EF 60-65%. Left ventricle cavity is normal in size. Normal global wall motion. Mild left ventricular  hypertrophy. Unable to evaluate diastolic function due to suboptimal image acquisition.  Mild (Grade I) mitral regurgitation.  No prior study for comparison.    Stress Testing: Exercise Tetrofosmin stress test 12/23/2021: Exercise nuclear stress test was performed using Bruce protocol. Patient reached 7 METS, and 86% of age predicted maximum heart rate. Exercise capacity was low. No chest pain reported. Heart rate and hemodynamic response were normal.  Stress EKG showed sinus tachycardia, RBBB, LAFB, periods of paroxysmal atrial tachycardia, occasional PVCs, frequent PACs, T wave inversion likely related to RBBB. Normal myocardial perfusion. Stress LVEF 57%. Low risk study.   Risk Assessment/Calculations:    Physical Exam:   VS:  There were no vitals taken for this visit.   Wt Readings from Last 3 Encounters:  03/10/23 199 lb 8.3 oz (90.5 kg)  01/05/23 193 lb (87.5 kg)  01/06/22 191 lb 12.8 oz (87 kg)    GEN: Well nourished, well developed in no acute distress NECK: No JVD; No carotid bruits CARDIAC: RRR, no murmurs, rubs, gallops RESPIRATORY:  CTA b/l without rales, wheezing or rhonchi  ABDOMEN: Soft, non-tender, non-distended EXTREMITIES:  No edema; No deformity   PPM site: steri strips are removed without difficulty, wound edges are well approximated, no erythema, edema, fluctuation, tenderness, no heat, no drainage, bleeding or hematoma  ASSESSMENT AND PLAN: .  PPM Site looks great No signs of infection Well healed Discussed remaining LUE restrictions Answered pt/wife's follow up questions   Dispo: back with Dr. Jimmey Ralph, as scheduled sooner if  needed  Signed, Sheilah Pigeon, PA-C

## 2023-03-24 ENCOUNTER — Ambulatory Visit: Payer: Medicare Other | Attending: Physician Assistant | Admitting: Physician Assistant

## 2023-03-24 ENCOUNTER — Encounter: Payer: Self-pay | Admitting: Physician Assistant

## 2023-03-24 VITALS — BP 120/84 | HR 81 | Ht 70.0 in | Wt 204.0 lb

## 2023-03-24 DIAGNOSIS — Z950 Presence of cardiac pacemaker: Secondary | ICD-10-CM | POA: Diagnosis not present

## 2023-03-24 DIAGNOSIS — Z5189 Encounter for other specified aftercare: Secondary | ICD-10-CM | POA: Diagnosis not present

## 2023-03-24 LAB — CUP PACEART INCLINIC DEVICE CHECK
Battery Remaining Longevity: 119 mo
Battery Voltage: 3.2 V
Brady Statistic AP VP Percent: 92.57 %
Brady Statistic AP VS Percent: 0 %
Brady Statistic AS VP Percent: 6.37 %
Brady Statistic AS VS Percent: 1.05 %
Brady Statistic RA Percent Paced: 92.52 %
Brady Statistic RV Percent Paced: 98.94 %
Date Time Interrogation Session: 20241219085408
Implantable Lead Connection Status: 753985
Implantable Lead Connection Status: 753985
Implantable Lead Implant Date: 20241206
Implantable Lead Implant Date: 20241206
Implantable Lead Location: 753859
Implantable Lead Location: 753860
Implantable Lead Model: 3830
Implantable Lead Model: 5076
Implantable Pulse Generator Implant Date: 20241206
Lead Channel Impedance Value: 418 Ohm
Lead Channel Impedance Value: 437 Ohm
Lead Channel Impedance Value: 494 Ohm
Lead Channel Impedance Value: 646 Ohm
Lead Channel Pacing Threshold Amplitude: 0.75 V
Lead Channel Pacing Threshold Pulse Width: 0.4 ms
Lead Channel Sensing Intrinsic Amplitude: 1.875 mV
Lead Channel Sensing Intrinsic Amplitude: 2.125 mV
Lead Channel Sensing Intrinsic Amplitude: 27.75 mV
Lead Channel Setting Pacing Amplitude: 3.5 V
Lead Channel Setting Pacing Amplitude: 3.5 V
Lead Channel Setting Pacing Pulse Width: 0.4 ms
Lead Channel Setting Sensing Sensitivity: 2 mV
Zone Setting Status: 755011
Zone Setting Status: 755011

## 2023-03-24 NOTE — Patient Instructions (Addendum)
Medication Instructions:     Your physician recommends that you continue on your current medications as directed. Please refer to the Current Medication list given to you today.   *If you need a refill on your cardiac medications before your next appointment, please call your pharmacy*   Lab Work:  NONE ORDERED  TODAY    If you have labs (blood work) drawn today and your tests are completely normal, you will receive your results only by: MyChart Message (if you have MyChart) OR A paper copy in the mail If you have any lab test that is abnormal or we need to change your treatment, we will call you to review the results.   Testing/Procedures: NONE ORDERED  TODAY    Follow-Up: At [facility], you and your health needs are our priority.  As part of our continuing mission to provide you with exceptional heart care, we have created designated Provider Care Teams.  These Care Teams include your primary Cardiologist (physician) and Advanced Practice Providers (APPs -  Physician Assistants and Nurse Practitioners) who all work together to provide you with the care you need, when you need it.  We recommend signing up for the patient portal called "MyChart".  Sign up information is provided on this After Visit Summary.  MyChart is used to connect with patients for Virtual Visits (Telemedicine).  Patients are able to view lab/test results, encounter notes, upcoming appointments, etc.  Non-urgent messages can be sent to your provider as well.   To learn more about what you can do with MyChart, go to [email or web address].    Your next appointment:   AS SCHEDULED   Provider:   Nobie Putnam, MD    Other Instructions

## 2023-03-25 ENCOUNTER — Ambulatory Visit: Payer: Medicare Other | Admitting: Student

## 2023-06-14 ENCOUNTER — Ambulatory Visit: Payer: Medicare Other

## 2023-06-14 DIAGNOSIS — I4510 Unspecified right bundle-branch block: Secondary | ICD-10-CM | POA: Diagnosis not present

## 2023-06-15 LAB — CUP PACEART REMOTE DEVICE CHECK
Battery Remaining Longevity: 92 mo
Battery Voltage: 3.13 V
Brady Statistic AP VP Percent: 85.43 %
Brady Statistic AP VS Percent: 0.2 %
Brady Statistic AS VP Percent: 11.14 %
Brady Statistic AS VS Percent: 3.23 %
Brady Statistic RA Percent Paced: 85.64 %
Brady Statistic RV Percent Paced: 96.57 %
Date Time Interrogation Session: 20250311100421
Implantable Lead Connection Status: 753985
Implantable Lead Connection Status: 753985
Implantable Lead Implant Date: 20241206
Implantable Lead Implant Date: 20241206
Implantable Lead Location: 753859
Implantable Lead Location: 753860
Implantable Lead Model: 3830
Implantable Lead Model: 5076
Implantable Pulse Generator Implant Date: 20241206
Lead Channel Impedance Value: 418 Ohm
Lead Channel Impedance Value: 456 Ohm
Lead Channel Impedance Value: 532 Ohm
Lead Channel Impedance Value: 570 Ohm
Lead Channel Pacing Threshold Amplitude: 0.5 V
Lead Channel Pacing Threshold Amplitude: 0.875 V
Lead Channel Pacing Threshold Pulse Width: 0.4 ms
Lead Channel Pacing Threshold Pulse Width: 0.4 ms
Lead Channel Sensing Intrinsic Amplitude: 1.875 mV
Lead Channel Sensing Intrinsic Amplitude: 1.875 mV
Lead Channel Sensing Intrinsic Amplitude: 25.625 mV
Lead Channel Sensing Intrinsic Amplitude: 25.625 mV
Lead Channel Setting Pacing Amplitude: 3 V
Lead Channel Setting Pacing Amplitude: 3.5 V
Lead Channel Setting Pacing Pulse Width: 0.4 ms
Lead Channel Setting Sensing Sensitivity: 2 mV
Zone Setting Status: 755011
Zone Setting Status: 755011

## 2023-07-04 ENCOUNTER — Encounter: Payer: Self-pay | Admitting: Cardiology

## 2023-07-04 ENCOUNTER — Ambulatory Visit: Payer: Medicare Other | Attending: Cardiology | Admitting: Cardiology

## 2023-07-04 VITALS — BP 120/62 | HR 75 | Ht 70.0 in | Wt 202.8 lb

## 2023-07-04 DIAGNOSIS — R001 Bradycardia, unspecified: Secondary | ICD-10-CM

## 2023-07-04 DIAGNOSIS — I4430 Unspecified atrioventricular block: Secondary | ICD-10-CM

## 2023-07-04 DIAGNOSIS — I10 Essential (primary) hypertension: Secondary | ICD-10-CM

## 2023-07-04 DIAGNOSIS — Z950 Presence of cardiac pacemaker: Secondary | ICD-10-CM

## 2023-07-04 NOTE — Patient Instructions (Signed)
 Medication Instructions:  Your physician recommends that you continue on your current medications as directed. Please refer to the Current Medication list given to you today.  *If you need a refill on your cardiac medications before your next appointment, please call your pharmacy*  Follow-Up: At [facility], you and your health needs are our priority.  As part of our continuing mission to provide you with exceptional heart care, our providers are all part of one team.  This team includes your primary Cardiologist (physician) and Advanced Practice Providers or APPs (Physician Assistants and Nurse Practitioners) who all work together to provide you with the care you need, when you need it.  Your next appointment:   1 year  Provider:   You may see Nobie Putnam, MD or one of the following Advanced Practice Providers on your designated Care Team:   Francis Dowse, [place] Casimiro Needle "Mardelle Matte" Pars[place], [place] Sherie Don, NP Canary Brim, NP       1st Floor: - Lobby - Registration  - Pharmacy  - Lab - Cafe  2nd Floor: - PV Lab - Diagnostic Testing (echo, CT, nuclear med)  3rd Floor: - Vacant  4th Floor: - TCTS (cardiothoracic surgery) - AFib Clinic - Structural Heart Clinic - Vascular Surgery  - Vascular Ultrasound  5th Floor: - HeartCare Cardiology (general and EP) - Clinical Pharmacy for coumadin, hypertension, lipid, weight-loss medications, and med management appointments    Valet parking services will be available as well.

## 2023-07-04 NOTE — Progress Notes (Signed)
 Electrophysiology Office Note:   Date:  07/04/2023  ID:  Randall Hudson, DOB 1940/12/13, MRN [id number]  Primary Cardiologist: Tessa Lerner, DO Electrophysiologist: Nobie Putnam, MD      History of Present Illness:   Randall Hudson is a 83 y.o. male with h/o pre-DM, HLD, HTN, hypothyroid, adrenal insufficiency, and intermittent CHB s/p dual chamber pacemaker who is being seen today for follow up evaluation post pacemaker implant.  Discussed the use of AI scribe software for clinical note transcription with the patient, who gave verbal consent to proceed.  History of Present Illness Randall Hudson "Randall Hudson" is an 83 year old male with a pacemaker who presents for a routine follow-up visit. He has a pacemaker that is functioning well, with good sensing of electrical activity in both the atria and ventricles. The device is used almost all the time, with usage rates of 92% for the atrial lead and 98.9% for the ventricular lead. He experienced dizziness twice last week when getting up in the middle of the night to go to the bathroom, which he attributes to having had a glass or two of wine at dinner. The dizziness resolved quickly. He reports occasional shortness of breath during heavy activity such as yard work or cutting trees, but notes significant improvement in the symptom compared to before the pacemaker was implanted. No swelling in the legs or shortness of breath when lying flat in bed. On March 8, he experienced a brief episode of arrhythmia, described as a little burst of fluttering, lasting two seconds.    Review of systems complete and found to be negative unless listed in HPI.   EP Information / Studies Reviewed:    EKG is ordered today. Personal review as below.  EKG Interpretation Date/Time:  Monday July 04 2023 14:48:17 EDT Ventricular Rate:  75 PR Interval:  180 QRS Duration:  164 QT Interval:  448 QTC Calculation: 500 R Axis:   -53  Text Interpretation: AV dual-paced rhythm  When compared with ECG of 12-Mar-2023 06:37, Vent. rate has increased BY   5 BPM Confirmed by Nobie Putnam [phone]) on 07/04/2023 9:42:36 PM   Echo 03/11/23:   1. Left ventricular ejection fraction, by estimation, is 60 to 65%. The  left ventricle has normal function. The left ventricle has no regional  wall motion abnormalities. There is mild left ventricular hypertrophy.  Left ventricular diastolic parameters  are consistent with Grade II diastolic dysfunction (pseudonormalization).   2. Right ventricular systolic function was not well visualized. The right  ventricular size is not well visualized. Tricuspid regurgitation signal is  inadequate for assessing PA pressure.   3. The mitral valve is grossly normal. Trivial mitral valve  regurgitation.   4. The aortic valve was not well visualized. Aortic valve regurgitation  is not visualized.   5. Aneurysm of the ascending aorta, measuring 40 mm.   6. The inferior vena cava is normal in size with greater than 50%  respiratory variability, suggesting right atrial pressure of 3 mmHg.   Physical Exam:   VS:  BP 120/62   Pulse 75   Ht 5\' 10"  (1.778 m)   Wt 202 lb 12.8 oz (92 kg)   SpO2 96%   BMI 29.10 kg/m    Wt Readings from Last 3 Encounters:  07/04/23 202 lb 12.8 oz (92 kg)  03/24/23 204 lb (92.5 kg)  03/10/23 199 lb 8.3 oz (90.5 kg)     GEN: Well nourished, well developed in no acute distress  NECK: No JVD CARDIAC: Normal rate, regular rhythm. RESPIRATORY:  Clear to auscultation without rales, wheezing or rhonchi  ABDOMEN: Soft, non-tender, non-distended EXTREMITIES:  No edema; No deformity   ASSESSMENT AND PLAN:   Randall Hudson is a 83 y.o. male with a history of pre-DM, HLD, HTN, bifascicular block, hypothyroid, and adrenal insufficiency who was seen for the evaluation of symptomatic bradycardia at the request of Dr. Anne Fu. Patient had high grade AV block, intermittent CHB and underwent DDD PPM on 12/6.   #S/p dual chamber  pacemaker: #High grade AV block: #Symptomatic bradycardia: -In clinic device interrogation was performed today.  Appropriate device function and stable lead parameters.  Estimated longevity 9.8 years.  Presenting rhythm is AP/VP. -1 episode of atrial tach, lasting 9 beats.  If more episodes occur then we will start metoprolol.  AT/AF burden less than 0.1%. -Continue with routine remote monitoring.  #Hypertension -At goal today.  Recommend checking blood pressures 1-2 times per week at home and recording the values.  Recommend bringing these recordings to the primary care physician.  Follow up with Dr. Jimmey Ralph in 12 months  Signed, Nobie Putnam, MD

## 2023-08-02 NOTE — Progress Notes (Signed)
 Remote pacemaker transmission.

## 2023-08-02 NOTE — Addendum Note (Signed)
 Addended by: Lott Rouleau A on: 08/02/2023 09:45 AM   Modules accepted: Orders

## 2023-09-13 ENCOUNTER — Ambulatory Visit (INDEPENDENT_AMBULATORY_CARE_PROVIDER_SITE_OTHER): Payer: Medicare Other

## 2023-09-13 DIAGNOSIS — R001 Bradycardia, unspecified: Secondary | ICD-10-CM | POA: Diagnosis not present

## 2023-09-13 LAB — CUP PACEART REMOTE DEVICE CHECK
Battery Remaining Longevity: 135 mo
Battery Voltage: 3.09 V
Brady Statistic AP VP Percent: 87.7 %
Brady Statistic AP VS Percent: 0.05 %
Brady Statistic AS VP Percent: 10.89 %
Brady Statistic AS VS Percent: 1.35 %
Brady Statistic RA Percent Paced: 87.86 %
Brady Statistic RV Percent Paced: 98.6 %
Date Time Interrogation Session: 20250610045728
Implantable Lead Connection Status: 753985
Implantable Lead Connection Status: 753985
Implantable Lead Implant Date: 20241206
Implantable Lead Implant Date: 20241206
Implantable Lead Location: 753859
Implantable Lead Location: 753860
Implantable Lead Model: 3830
Implantable Lead Model: 5076
Implantable Pulse Generator Implant Date: 20241206
Lead Channel Impedance Value: 380 Ohm
Lead Channel Impedance Value: 437 Ohm
Lead Channel Impedance Value: 513 Ohm
Lead Channel Impedance Value: 532 Ohm
Lead Channel Pacing Threshold Amplitude: 0.5 V
Lead Channel Pacing Threshold Amplitude: 1 V
Lead Channel Pacing Threshold Pulse Width: 0.4 ms
Lead Channel Pacing Threshold Pulse Width: 0.4 ms
Lead Channel Sensing Intrinsic Amplitude: 2.125 mV
Lead Channel Sensing Intrinsic Amplitude: 2.125 mV
Lead Channel Sensing Intrinsic Amplitude: 31.25 mV
Lead Channel Sensing Intrinsic Amplitude: 31.25 mV
Lead Channel Setting Pacing Amplitude: 1.5 V
Lead Channel Setting Pacing Amplitude: 2 V
Lead Channel Setting Pacing Pulse Width: 0.4 ms
Lead Channel Setting Sensing Sensitivity: 2 mV
Zone Setting Status: 755011
Zone Setting Status: 755011

## 2023-09-22 ENCOUNTER — Ambulatory Visit: Payer: Self-pay | Admitting: Cardiology

## 2023-12-13 ENCOUNTER — Ambulatory Visit: Payer: Medicare Other

## 2023-12-13 DIAGNOSIS — R001 Bradycardia, unspecified: Secondary | ICD-10-CM | POA: Diagnosis not present

## 2023-12-14 LAB — CUP PACEART REMOTE DEVICE CHECK
Battery Remaining Longevity: 132 mo
Battery Voltage: 3.04 V
Brady Statistic AP VP Percent: 84.92 %
Brady Statistic AP VS Percent: 0.02 %
Brady Statistic AS VP Percent: 14.35 %
Brady Statistic AS VS Percent: 0.72 %
Brady Statistic RA Percent Paced: 84.9 %
Brady Statistic RV Percent Paced: 99.27 %
Date Time Interrogation Session: 20250910121335
Implantable Lead Connection Status: 753985
Implantable Lead Connection Status: 753985
Implantable Lead Implant Date: 20241206
Implantable Lead Implant Date: 20241206
Implantable Lead Location: 753859
Implantable Lead Location: 753860
Implantable Lead Model: 3830
Implantable Lead Model: 5076
Implantable Pulse Generator Implant Date: 20241206
Lead Channel Impedance Value: 380 Ohm
Lead Channel Impedance Value: 437 Ohm
Lead Channel Impedance Value: 532 Ohm
Lead Channel Impedance Value: 532 Ohm
Lead Channel Pacing Threshold Amplitude: 0.5 V
Lead Channel Pacing Threshold Amplitude: 1 V
Lead Channel Pacing Threshold Pulse Width: 0.4 ms
Lead Channel Pacing Threshold Pulse Width: 0.4 ms
Lead Channel Sensing Intrinsic Amplitude: 26.75 mV
Lead Channel Sensing Intrinsic Amplitude: 26.75 mV
Lead Channel Sensing Intrinsic Amplitude: 3.875 mV
Lead Channel Sensing Intrinsic Amplitude: 3.875 mV
Lead Channel Setting Pacing Amplitude: 1.5 V
Lead Channel Setting Pacing Amplitude: 2 V
Lead Channel Setting Pacing Pulse Width: 0.4 ms
Lead Channel Setting Sensing Sensitivity: 2 mV
Zone Setting Status: 755011
Zone Setting Status: 755011

## 2023-12-15 ENCOUNTER — Encounter: Payer: Self-pay | Admitting: Cardiology

## 2023-12-22 NOTE — Progress Notes (Signed)
 Remote PPM Transmission

## 2023-12-25 ENCOUNTER — Ambulatory Visit: Payer: Self-pay | Admitting: Cardiology

## 2024-03-13 ENCOUNTER — Ambulatory Visit: Payer: Medicare Other

## 2024-03-14 LAB — CUP PACEART REMOTE DEVICE CHECK
Battery Remaining Longevity: 118 mo
Battery Voltage: 3.02 V
Brady Statistic AP VP Percent: 88.02 %
Brady Statistic AP VS Percent: 0 %
Brady Statistic AS VP Percent: 11.88 %
Brady Statistic AS VS Percent: 0.09 %
Brady Statistic RA Percent Paced: 87.86 %
Brady Statistic RV Percent Paced: 99.9 %
Date Time Interrogation Session: 20251210103606
Implantable Lead Connection Status: 753985
Implantable Lead Connection Status: 753985
Implantable Lead Implant Date: 20241206
Implantable Lead Implant Date: 20241206
Implantable Lead Location: 753859
Implantable Lead Location: 753860
Implantable Lead Model: 3830
Implantable Lead Model: 5076
Implantable Pulse Generator Implant Date: 20241206
Lead Channel Impedance Value: 399 Ohm
Lead Channel Impedance Value: 456 Ohm
Lead Channel Impedance Value: 532 Ohm
Lead Channel Impedance Value: 551 Ohm
Lead Channel Pacing Threshold Amplitude: 0.5 V
Lead Channel Pacing Threshold Amplitude: 1.25 V
Lead Channel Pacing Threshold Pulse Width: 0.4 ms
Lead Channel Pacing Threshold Pulse Width: 0.4 ms
Lead Channel Sensing Intrinsic Amplitude: 26.75 mV
Lead Channel Sensing Intrinsic Amplitude: 26.75 mV
Lead Channel Sensing Intrinsic Amplitude: 4 mV
Lead Channel Sensing Intrinsic Amplitude: 4 mV
Lead Channel Setting Pacing Amplitude: 1.5 V
Lead Channel Setting Pacing Amplitude: 2.5 V
Lead Channel Setting Pacing Pulse Width: 0.4 ms
Lead Channel Setting Sensing Sensitivity: 2 mV
Zone Setting Status: 755011
Zone Setting Status: 755011

## 2024-03-20 NOTE — Progress Notes (Signed)
 Remote PPM Transmission

## 2024-03-23 ENCOUNTER — Ambulatory Visit: Payer: Self-pay | Admitting: Cardiology

## 2024-07-03 ENCOUNTER — Ambulatory Visit: Admitting: Pulmonary Disease
